# Patient Record
Sex: Female | Born: 1945
Health system: Southern US, Community
[De-identification: ages and names within clinical notes are randomized; demographics above are authoritative.]

## PROBLEM LIST (undated history)

## (undated) DIAGNOSIS — E785 Hyperlipidemia, unspecified: Secondary | ICD-10-CM

## (undated) HISTORY — DX: Hyperlipidemia, unspecified: E78.5

## (undated) HISTORY — PX: EYE SURGERY: SHX253

## (undated) HISTORY — PX: ABDOMINAL HYSTERECTOMY: SHX81

## (undated) HISTORY — PX: APPENDECTOMY: SHX54

---

## 2005-04-26 ENCOUNTER — Ambulatory Visit: Payer: Self-pay | Admitting: Unknown Physician Specialty

## 2005-12-09 HISTORY — PX: COLONOSCOPY: SHX174

## 2005-12-25 ENCOUNTER — Ambulatory Visit: Payer: Self-pay | Admitting: Gastroenterology

## 2006-01-10 ENCOUNTER — Emergency Department: Payer: Self-pay | Admitting: Emergency Medicine

## 2006-04-30 ENCOUNTER — Ambulatory Visit: Payer: Self-pay | Admitting: Unknown Physician Specialty

## 2007-02-12 ENCOUNTER — Ambulatory Visit: Payer: Self-pay | Admitting: Ophthalmology

## 2007-05-28 ENCOUNTER — Ambulatory Visit: Payer: Self-pay | Admitting: Unknown Physician Specialty

## 2008-06-07 ENCOUNTER — Ambulatory Visit: Payer: Self-pay | Admitting: Family Medicine

## 2009-09-19 ENCOUNTER — Ambulatory Visit: Payer: Self-pay | Admitting: Unknown Physician Specialty

## 2009-10-26 ENCOUNTER — Ambulatory Visit: Payer: Self-pay | Admitting: Gastroenterology

## 2010-03-14 ENCOUNTER — Ambulatory Visit: Payer: Self-pay | Admitting: Ophthalmology

## 2010-03-29 ENCOUNTER — Ambulatory Visit: Payer: Self-pay | Admitting: Ophthalmology

## 2010-09-01 ENCOUNTER — Ambulatory Visit: Payer: Self-pay | Admitting: Family Medicine

## 2010-09-24 ENCOUNTER — Ambulatory Visit: Payer: Self-pay | Admitting: Specialist

## 2010-09-25 ENCOUNTER — Ambulatory Visit: Payer: Self-pay | Admitting: Unknown Physician Specialty

## 2010-11-09 HISTORY — PX: LUMBAR MICRODISCECTOMY: SHX99

## 2010-11-23 ENCOUNTER — Encounter (HOSPITAL_COMMUNITY)
Admission: RE | Admit: 2010-11-23 | Discharge: 2010-11-23 | Disposition: A | Discharge status: Home / Self Care | Payer: BC Managed Care – PPO | Source: Ambulatory Visit | Attending: Neurosurgery | Admitting: Neurosurgery

## 2010-11-23 LAB — TYPE AND SCREEN: ABO/RH(D): O POS

## 2010-11-23 LAB — SURGICAL PCR SCREEN: MRSA, PCR: NEGATIVE

## 2010-11-23 LAB — CBC
Hemoglobin: 11.3 g/dL — ABNORMAL LOW (ref 12.0–15.0)
MCH: 29.4 pg (ref 26.0–34.0)
MCHC: 32.5 g/dL (ref 30.0–36.0)
MCV: 90.4 fL (ref 78.0–100.0)

## 2010-11-23 LAB — DIFFERENTIAL
Basophils Relative: 0 % (ref 0–1)
Eosinophils Absolute: 0.2 10*3/uL (ref 0.0–0.7)
Monocytes Absolute: 0.7 10*3/uL (ref 0.1–1.0)
Monocytes Relative: 10 % (ref 3–12)
Neutro Abs: 4.6 10*3/uL (ref 1.7–7.7)

## 2010-11-27 ENCOUNTER — Ambulatory Visit (HOSPITAL_COMMUNITY)
Admission: RE | Admit: 2010-11-27 | Discharge: 2010-11-27 | Disposition: A | Discharge status: Home / Self Care | Payer: BC Managed Care – PPO | Source: Ambulatory Visit | Attending: Neurosurgery | Admitting: Neurosurgery

## 2010-11-27 ENCOUNTER — Inpatient Hospital Stay (HOSPITAL_COMMUNITY): Payer: BC Managed Care – PPO

## 2010-11-27 DIAGNOSIS — M5126 Other intervertebral disc displacement, lumbar region: Secondary | ICD-10-CM | POA: Insufficient documentation

## 2010-11-27 DIAGNOSIS — Z01812 Encounter for preprocedural laboratory examination: Secondary | ICD-10-CM | POA: Insufficient documentation

## 2010-12-06 NOTE — Op Note (Signed)
Janet, Craig              ACCOUNT NO.:  [id number]  MEDICAL RECORD NO.:  [id number]           PATIENT TYPE:  I  LOCATION:  3526                         FACILITY:  [facility]  PHYSICIAN:  Janet Craig Janet Craig, M.D.    DATE OF BIRTH:  Feb 16, 1946  DATE OF PROCEDURE:  11/27/2010 DATE OF DISCHARGE:  11/27/2010                              OPERATIVE REPORT   PREOPERATIVE DIAGNOSIS:  Right L2-3 herniated nucleus pulposus with radiculopathy.  POSTOPERATIVE DIAGNOSIS:  Right L2-3 herniated nucleus pulposus with radiculopathy.  PROCEDURE NOTE:  Right L2-3 laminotomy and microdiskectomy.  SURGEON:  Janet Craig. Janet Riera, MD.  ASSISTANT:  Janet Meeker, MD  ANESTHESIA:  General endotracheal.  INDICATION:  [name] is a 65 year old female with history of back and right groin and anterior thigh pain consistent with right-sided L2 radiculopathy failing conservative management.  Workup demonstrates evidence of a right-sided L2-3 herniated nucleus pulposus with the superior fragment causing compression of the exiting L2 nerve root.  The patient has been counseled as to her options.  She has failed conservative management and presents now for microdiskectomy in hopes of improving her symptoms.  DESCRIPTION OF OPERATION:  The patient was brought to the operating room and placed in the operating table in a supine position.  After adequate level of anesthesia was achieved, the patient was placed on prone onto the Wilson frame and appropriately padded.  The patient's lumbar region was prepped and draped sterilely.  A 10-blade was used to make a curvilinear skin incision overlying the L2-3 interspace.  This was carried down sharply in the midline.  Subperiosteal dissection was performed exposing the lamina of the facet joints, L2 and L3 on the right side.  Deep self-retaining retractor was placed.  Intraoperative x- rays were taken, level was confirmed.  Laminotomy was then performed using a  high-speed drill and Kerrison rongeurs to remove the inferior aspect of the lamina of L2, medial aspect of the L2-3 facet joints, and superior rim of the L3.  The ligamentum flavum was then elevated and resected in piecemeal fashion using Kerrison rongeurs.  The underlying thecal sac and exiting L3 nerve roots were identified.  Microscope was then brought to field for microdissection of the right-sided L2 and L3 nerve roots as well as the underlying disk.  Epidural venous plexus coagulated and cut.  Thecal sac and L3 nerve root was gently mobilized and tracked towards midline.  Disk space and disk herniation was readily apparent.  An incision was made over the superior fragment extending towards the right-sided L2 foramen.  A blunt nerve hook was then used to dissect free a number of large pieces of free disk herniation.  This were completely resected.  The disk space itself was otherwise flat. The annular defect was small.  It was not felt that an intradiskal diskectomy would be of benefit.  At this point, a very thorough decompression had been achieved.  There was no injury to the thecal sac or nerve roots.  Wound was then irrigated with antibiotic solution. Gelfoam was placed topically for hemostasis, which was found to be good.  Microscope and retractors were  removed.  Hemostasis was then achieved with electrocautery.  The wound was then closed in layers with Vicryl sutures.  Steri-Strips and sterile dressing were applied.  The patient tolerated the procedure well and she returns to the recovery room postoperatively.          ______________________________ Janet Craig Zaara Sprowl, M.D.     HAP/MEDQ  D:  11/27/2010  T:  11/28/2010  Job:  [id number]  Electronically Signed by Janet Craig M.D. on 12/06/2010 04:06:19 PM

## 2011-01-14 ENCOUNTER — Ambulatory Visit: Payer: Self-pay | Admitting: Neurosurgery

## 2011-01-31 ENCOUNTER — Encounter: Payer: Self-pay | Admitting: Neurosurgery

## 2011-02-09 ENCOUNTER — Encounter: Payer: Self-pay | Admitting: Neurosurgery

## 2011-03-11 ENCOUNTER — Encounter: Payer: Self-pay | Admitting: Neurosurgery

## 2012-01-17 ENCOUNTER — Ambulatory Visit: Payer: Self-pay | Admitting: Family Medicine

## 2012-01-24 ENCOUNTER — Ambulatory Visit: Payer: Self-pay | Admitting: Family Medicine

## 2012-02-11 ENCOUNTER — Ambulatory Visit: Payer: Self-pay | Admitting: General Practice

## 2012-02-11 LAB — BASIC METABOLIC PANEL
Anion Gap: 7 (ref 7–16)
BUN: 6 mg/dL — ABNORMAL LOW (ref 7–18)
Chloride: 103 mmol/L (ref 98–107)
Co2: 30 mmol/L (ref 21–32)
Creatinine: 0.56 mg/dL — ABNORMAL LOW (ref 0.60–1.30)
EGFR (Non-African Amer.): >60
Glucose: 98 mg/dL (ref 65–99)
Osmolality: 277 (ref 275–301)
Sodium: 140 mmol/L (ref 136–145)

## 2012-02-11 LAB — URINALYSIS, COMPLETE
Bacteria: NONE SEEN
Bilirubin,UR: NEGATIVE
Glucose,UR: NEGATIVE mg/dL (ref 0–75)
Leukocyte Esterase: NEGATIVE

## 2012-02-11 LAB — CBC
HCT: 38.5 % (ref 35.0–47.0)
HGB: 12.6 g/dL (ref 12.0–16.0)
MCH: 29.7 pg (ref 26.0–34.0)
MCHC: 32.6 g/dL (ref 32.0–36.0)
Platelet: 289 10*3/uL (ref 150–440)
RBC: 4.23 10*6/uL (ref 3.80–5.20)
RDW: 13.4 % (ref 11.5–14.5)
WBC: 4.6 10*3/uL (ref 3.6–11.0)

## 2012-02-11 LAB — SEDIMENTATION RATE: Erythrocyte Sed Rate: 15 mm/hr (ref 0–30)

## 2012-02-11 LAB — PROTIME-INR: Prothrombin Time: 12.5 secs (ref 11.5–14.7)

## 2012-02-11 LAB — APTT: Activated PTT: 32.6 secs (ref 23.6–35.9)

## 2012-02-12 LAB — URINE CULTURE

## 2012-02-25 ENCOUNTER — Inpatient Hospital Stay: Payer: Self-pay | Admitting: General Practice

## 2012-02-26 LAB — BASIC METABOLIC PANEL
Co2: 28 mmol/L (ref 21–32)
Creatinine: 0.6 mg/dL (ref 0.60–1.30)
EGFR (African American): >60
Osmolality: 271 (ref 275–301)
Potassium: 3.5 mmol/L (ref 3.5–5.1)

## 2012-02-26 LAB — PLATELET COUNT: Platelet: 238 10*3/uL (ref 150–440)

## 2012-02-26 LAB — HEMOGLOBIN
HGB: 8.3 g/dL — ABNORMAL LOW (ref 12.0–16.0)
HGB: 8.5 g/dL — ABNORMAL LOW (ref 12.0–16.0)

## 2012-02-27 LAB — PLATELET COUNT: Platelet: 238 10*3/uL (ref 150–440)

## 2012-02-27 LAB — BASIC METABOLIC PANEL
Anion Gap: 9 (ref 7–16)
Calcium, Total: 7.7 mg/dL — ABNORMAL LOW (ref 8.5–10.1)
Chloride: 99 mmol/L (ref 98–107)
Co2: 28 mmol/L (ref 21–32)
Glucose: 116 mg/dL — ABNORMAL HIGH (ref 65–99)
Osmolality: 270 (ref 275–301)
Potassium: 3.7 mmol/L (ref 3.5–5.1)

## 2012-08-06 ENCOUNTER — Ambulatory Visit: Payer: Self-pay | Admitting: Family Medicine

## 2012-10-08 ENCOUNTER — Telehealth: Payer: Self-pay | Admitting: Gastroenterology

## 2013-02-10 ENCOUNTER — Ambulatory Visit: Payer: Self-pay | Admitting: General Practice

## 2013-02-10 DIAGNOSIS — Z01810 Encounter for preprocedural cardiovascular examination: Secondary | ICD-10-CM

## 2013-02-10 LAB — URINALYSIS, COMPLETE
Bacteria: NONE SEEN
Bilirubin,UR: NEGATIVE
Blood: NEGATIVE
Glucose,UR: NEGATIVE mg/dL (ref 0–75)
Ketone: NEGATIVE
Leukocyte Esterase: NEGATIVE
Nitrite: NEGATIVE
Ph: 7 (ref 4.5–8.0)
Protein: NEGATIVE
RBC,UR: <1 /HPF (ref 0–5)
Specific Gravity: 1.018 (ref 1.003–1.030)
Squamous Epithelial: <1
WBC UR: <1 /HPF (ref 0–5)

## 2013-02-10 LAB — CBC
HCT: 36.8 % (ref 35.0–47.0)
HGB: 12.3 g/dL (ref 12.0–16.0)
MCH: 29.4 pg (ref 26.0–34.0)
MCHC: 33.5 g/dL (ref 32.0–36.0)
MCV: 88 fL (ref 80–100)
Platelet: 296 10*3/uL (ref 150–440)
RBC: 4.2 10*6/uL (ref 3.80–5.20)
RDW: 14.1 % (ref 11.5–14.5)
WBC: 4.9 10*3/uL (ref 3.6–11.0)

## 2013-02-10 LAB — BASIC METABOLIC PANEL
Anion Gap: 6 — ABNORMAL LOW (ref 7–16)
Calcium, Total: 8.9 mg/dL (ref 8.5–10.1)
Chloride: 105 mmol/L (ref 98–107)
Co2: 28 mmol/L (ref 21–32)
Creatinine: 0.59 mg/dL — ABNORMAL LOW (ref 0.60–1.30)
Sodium: 139 mmol/L (ref 136–145)

## 2013-02-10 LAB — MRSA PCR SCREENING

## 2013-02-10 LAB — PROTIME-INR
INR: 0.9
Prothrombin Time: 12.6 secs (ref 11.5–14.7)

## 2013-02-10 LAB — SEDIMENTATION RATE: Erythrocyte Sed Rate: 11 mm/hr (ref 0–30)

## 2013-02-10 LAB — APTT: Activated PTT: 31.7 secs (ref 23.6–35.9)

## 2013-02-23 ENCOUNTER — Inpatient Hospital Stay: Payer: Self-pay | Admitting: General Practice

## 2013-02-24 HISTORY — PX: TOTAL HIP ARTHROPLASTY: SHX124

## 2013-02-24 LAB — BASIC METABOLIC PANEL
Anion Gap: 7 (ref 7–16)
BUN: 5 mg/dL — ABNORMAL LOW (ref 7–18)
Calcium, Total: 8.1 mg/dL — ABNORMAL LOW (ref 8.5–10.1)
Creatinine: 0.53 mg/dL — ABNORMAL LOW (ref 0.60–1.30)
EGFR (African American): >60
EGFR (Non-African Amer.): >60
Glucose: 130 mg/dL — ABNORMAL HIGH (ref 65–99)
Osmolality: 273 (ref 275–301)
Sodium: 137 mmol/L (ref 136–145)

## 2013-02-24 LAB — HEMOGLOBIN: HGB: 9.7 g/dL — ABNORMAL LOW (ref 12.0–16.0)

## 2013-02-24 LAB — PLATELET COUNT: Platelet: 236 10*3/uL (ref 150–440)

## 2013-02-25 LAB — BASIC METABOLIC PANEL
Anion Gap: 6 — ABNORMAL LOW (ref 7–16)
BUN: 4 mg/dL — ABNORMAL LOW (ref 7–18)
Calcium, Total: 7.8 mg/dL — ABNORMAL LOW (ref 8.5–10.1)
Co2: 26 mmol/L (ref 21–32)
Potassium: 3.4 mmol/L — ABNORMAL LOW (ref 3.5–5.1)

## 2013-02-25 LAB — HEMOGLOBIN: HGB: 9.5 g/dL — ABNORMAL LOW (ref 12.0–16.0)

## 2013-11-12 ENCOUNTER — Ambulatory Visit: Payer: Self-pay | Admitting: Family Medicine

## 2014-10-22 LAB — CBC AND DIFFERENTIAL
HCT: 37 % (ref 36–46)
HEMOGLOBIN: 12.6 g/dL (ref 12.0–16.0)
NEUTROS ABS: 3 /uL
PLATELETS: 300 10*3/uL (ref 150–399)
WBC: 4.3 10^3/mL

## 2014-10-22 LAB — LIPID PANEL
Cholesterol: 211 mg/dL — AB (ref 0–200)
HDL: 48 mg/dL (ref 35–70)
LDL Cholesterol: 145 mg/dL
Triglycerides: 91 mg/dL (ref 40–160)

## 2014-10-22 LAB — TSH: TSH: 0.06 u[IU]/mL — AB (ref 0.41–5.90)

## 2014-10-22 LAB — BASIC METABOLIC PANEL
BUN: 9 mg/dL (ref 4–21)
CREATININE: 0.6 mg/dL (ref 0.5–1.1)
GLUCOSE: 110 mg/dL
POTASSIUM: 4.1 mmol/L (ref 3.4–5.3)
SODIUM: 139 mmol/L (ref 137–147)

## 2014-10-22 LAB — HEMOGLOBIN A1C: Hemoglobin A1C: 6.1

## 2014-10-22 LAB — HEPATIC FUNCTION PANEL
ALT: 13 U/L (ref 7–35)
AST: 17 U/L (ref 13–35)
Alkaline Phosphatase: 73 U/L (ref 25–125)
BILIRUBIN, TOTAL: 0.5 mg/dL

## 2014-10-27 ENCOUNTER — Ambulatory Visit: Payer: Self-pay | Admitting: Family Medicine

## 2014-10-29 ENCOUNTER — Ambulatory Visit: Payer: Self-pay | Admitting: Family Medicine

## 2014-12-07 ENCOUNTER — Ambulatory Visit: Admit: 2014-12-07 | Disposition: A | Discharge status: Home / Self Care | Payer: Self-pay | Admitting: Gastroenterology

## 2014-12-07 LAB — HM COLONOSCOPY

## 2014-12-31 NOTE — Op Note (Signed)
PATIENT NAME:  Janet Craig, Janet Craig MR#:  [id number] DATE OF BIRTH:  1945-11-13  DATE OF PROCEDURE:  02/23/2013  PREOPERATIVE DIAGNOSIS: Degenerative arthrosis of the left hip.   POSTOPERATIVE DIAGNOSIS: Degenerative arthrosis of the left hip.   PROCEDURE PERFORMED: Left total hip arthroplasty.   SURGEON: Laurice Record. Hooten, MD.  ASSISTANTVance Peper, PA.   ANESTHESIA: Spinal.   ESTIMATED BLOOD LOSS: 200 mL.   FLUIDS REPLACED: 1500 mL of crystalloid.   DRAINS: Two medium drains to Hemovac reservoir.   IMPLANTS UTILIZED: DePuy 13.5 mm small stature AML femoral component, a 48 mm outer diameter Pinnacle 100 acetabular component, +4 mm neutral Pinnacle Marathon polyethylene liner and a 32 mm outer diameter cobalt chrome hip ball with a +1 mm neck length.   INDICATIONS FOR SURGERY: The patient is a 69 year old female, who has been seen for complaints of progressive left hip and groin pain. She has noticed some decrease in her range of motion. X-rays demonstrated severe degenerative changes. After discussion of the risks and benefits of surgical intervention, the patient expressed understanding of the risks, benefits and agreed with plans for surgical intervention. Of note, the patient had done well following a right total hip arthroplasty for degenerative arthrosis.   PROCEDURE IN DETAIL: The patient was brought to the operating room and, after adequate spinal anesthesia was achieved, the patient was placed in right lateral decubitus position. Axillary roll was placed and all bony prominences were well padded. The patient's left hip and leg were cleaned and prepped with alcohol and DuraPrep and draped in the usual sterile fashion. A "timeout" was performed as per usual protocol. A lateral curvilinear incision was made gently curving towards the posterior superior iliac spine. IT band was incised in line with the skin incision and the fibers of the gluteus maximus were split in line. The piriformis  tendon was identified, skeletonized, and incised at its insertion on the proximal femur and reflected posteriorly. In a similar fashion, short external rotators were incised and reflected posteriorly. A T-type posterior capsulotomy was performed. Prior to dislocation of the femoral head, a threaded Steinmann pin was inserted into the pelvis superior to the acetabulum through a separate stab incision and then bent in the form of a stylus so as to assess limb length and hip offset throughout the procedure. The femoral head was then dislocated posteriorly. Severe degenerative changes were noted, especially along the superior aspect of the femoral head. The femoral neck cut was performed using an oscillating saw. The anterior capsule was elevated off of the femoral neck. Inspection of the acetabulum also demonstrated significant degenerative changes. Remnant of the labrum was excised. The acetabulum was reamed in a sequential fashion up to a 47 mm diameter. Excellent punctate bleeding bone was encountered.   A 48 mm outer diameter Pinnacle 100 acetabular component was positioned and impacted into place. Excellent scratch fit was appreciated. A +4 mm neutral polyethylene trial was inserted and attention was directed to the proximal femur. Pilot hole for reaming of the proximal femoral canal was created using a high-speed bur. Proximal femoral canal was reamed in a sequential fashion up to a 13 mm diameter. The proximal femur was then prepared using a 13.5 mm aggressive side-biting reamer. Serial broaches were inserted up to a 13.5 mm small stature broach. The calcar region was planed and trial reduction was performed with a 32 mm hip ball with a +1 mm neck length. Excellent equalization of limb lengths was appreciated and good hip offset  was noted. Excellent stability was appreciated both anteriorly and posteriorly. Trial components were removed. The acetabular shell was irrigated and suctioned dry. A +4 mm neutral  Pinnacle Marathon polyethylene liner was positioned and impacted into place. Next, a 13.5 mm small stature AML femoral component was initially seated. There was approximately 7+ cm of scratch fit. It was thus elected to ream lint to line using a 13.5 mm reamer on a T handle. The femoral component was re-seated and impacted into place. Excellent scratch fit was appreciated. The Morse taper was cleaned and dried. A 32 mm outer diameter cobalt chrome hip ball with a +1 mm neck segment was placed on the trunnion and impacted into place. The hip was reduced and placed through a range of motion. Excellent equalization of limb lengths and hip offset were noted. Excellent stability was appreciated both anteriorly and posteriorly.   The hip was irrigated with copious amounts of normal saline with antibiotic solution using pulsatile lavage and then suctioned dry. The posterior capsulotomy was repaired using #5 Ethibond. The piriformis tendon was reapproximated on the undersurface of the gluteus medius tendon using #5 Ethibond. Two medium drains were placed in the wound bed and brought out through a separate stab incision to be attached to Hemovac reservoir. IT band was repaired using interrupted sutures of #1 Vicryl. The subcutaneous tissue was approximated in layers using first #0 Vicryl followed by 2-0 Vicryl. Skin was closed with skin staples. A sterile dressing was applied. The patient tolerated the procedure well. She was transported to the recovery room in stable condition.   ____________________________ Laurice Record. Holley Bouche., MD jph:aw D: 02/23/2013 11:16:00 ET T: 02/23/2013 11:46:49 ET JOB#: [id number]  cc: Jeneen Rinks P. Holley Bouche., MD, <Dictator> JAMES P Holley Bouche MD ELECTRONICALLY SIGNED 02/25/2013 11:28

## 2014-12-31 NOTE — Op Note (Signed)
PATIENT NAME:  Janet Craig, Janet Craig MR#:  [id number] DATE OF BIRTH:  [birth year]  DATE OF PROCEDURE:  02/23/2013  PREOPERATIVE DIAGNOSIS: Degenerative arthrosis of the left hip.   POSTOPERATIVE DIAGNOSIS: Degenerative arthrosis of the left hip.   PROCEDURE PERFORMED: Left total hip arthroplasty.   SURGEON: Laurice Record. Hooten, MD.  ASSISTANTVance Peper, PA.   ANESTHESIA: Spinal.   ESTIMATED BLOOD LOSS: 200 mL.   FLUIDS REPLACED: 1500 mL of crystalloid.   DRAINS: Two medium drains to Hemovac reservoir.   IMPLANTS UTILIZED: DePuy 13.5 mm small stature AML femoral component, a 48 mm outer diameter Pinnacle 100 acetabular component, +4 mm neutral Pinnacle Marathon polyethylene liner and a 32 mm outer diameter cobalt chrome hip ball with a +1 mm neck length.   INDICATIONS FOR SURGERY: The patient is a 69 year old female, who has been seen for complaints of progressive left hip and groin pain. She has noticed some decrease in her range of motion. X-rays demonstrated severe degenerative changes. After discussion of the risks and benefits of surgical intervention, the patient expressed understanding of the risks, benefits and agreed with plans for surgical intervention. Of note, the patient had done well following a right total hip arthroplasty for degenerative arthrosis.   PROCEDURE IN DETAIL: The patient was brought to the operating room and, after adequate spinal anesthesia was achieved, the patient was placed in right lateral decubitus position. Axillary roll was placed and all bony prominences were well padded. The patient's left hip and leg were cleaned and prepped with alcohol and DuraPrep and draped in the usual sterile fashion. A "timeout" was performed as per usual protocol. A lateral curvilinear incision was made gently curving towards the posterior superior iliac spine. IT band was incised in line with the skin incision and the fibers of the gluteus maximus were split in line. The piriformis  tendon was identified, skeletonized, and incised at its insertion on the proximal femur and reflected posteriorly. In a similar fashion, short external rotators were incised and reflected posteriorly. A T-type posterior capsulotomy was performed. Prior to dislocation of the femoral head, a threaded Steinmann pin was inserted into the pelvis superior to the acetabulum through a separate stab incision and then bent in the form of a stylus so as to assess limb length and hip offset throughout the procedure. The femoral head was then dislocated posteriorly. Severe degenerative changes were noted, especially along the superior aspect of the femoral head. The femoral neck cut was performed using an oscillating saw. The anterior capsule was elevated off of the femoral neck. Inspection of the acetabulum also demonstrated significant degenerative changes. Remnant of the labrum was excised. The acetabulum was reamed in a sequential fashion up to a 47 mm diameter. Excellent punctate bleeding bone was encountered.   A 48 mm outer diameter Pinnacle 100 acetabular component was positioned and impacted into place. Excellent scratch fit was appreciated. A +4 mm neutral polyethylene trial was inserted and attention was directed to the proximal femur. Pilot hole for reaming of the proximal femoral canal was created using a high-speed bur. Proximal femoral canal was reamed in a sequential fashion up to a 13 mm diameter. The proximal femur was then prepared using a 13.5 mm aggressive side-biting reamer. Serial broaches were inserted up to a 13.5 mm small stature broach. The calcar region was planed and trial reduction was performed with a 32 mm hip ball with a +1 mm neck length. Excellent equalization of limb lengths was appreciated and good hip offset  was noted. Excellent stability was appreciated both anteriorly and posteriorly. Trial components were removed. The acetabular shell was irrigated and suctioned dry. A +4 mm neutral  Pinnacle Marathon polyethylene liner was positioned and impacted into place. Next, a 13.5 mm small stature AML femoral component was initially seated. There was approximately 7+ cm of scratch fit. It was thus elected to ream lint to line using a 13.5 mm reamer on a T handle. The femoral component was re-seated and impacted into place. Excellent scratch fit was appreciated. The Morse taper was cleaned and dried. A 32 mm outer diameter cobalt chrome hip ball with a +1 mm neck segment was placed on the trunnion and impacted into place. The hip was reduced and placed through a range of motion. Excellent equalization of limb lengths and hip offset were noted. Excellent stability was appreciated both anteriorly and posteriorly.   The hip was irrigated with copious amounts of normal saline with antibiotic solution using pulsatile lavage and then suctioned dry. The posterior capsulotomy was repaired using #5 Ethibond. The piriformis tendon was reapproximated on the undersurface of the gluteus medius tendon using #5 Ethibond. Two medium drains were placed in the wound bed and brought out through a separate stab incision to be attached to Hemovac reservoir. IT band was repaired using interrupted sutures of #1 Vicryl. The subcutaneous tissue was approximated in layers using first #0 Vicryl followed by 2-0 Vicryl. Skin was closed with skin staples. A sterile dressing was applied. The patient tolerated the procedure well. She was transported to the recovery room in stable condition.   ____________________________ Laurice Record. Holley Bouche., MD jph:aw D: 02/23/2013 11:16:58 ET T: 02/23/2013 11:46:49 ET JOB#: [id number]  cc: Jeneen Rinks P. Holley Bouche., MD, <Dictator>

## 2014-12-31 NOTE — Discharge Summary (Signed)
PATIENT NAME:  Janet Craig, Janet Craig MR#:  [id number] DATE OF BIRTH:  08-10-1946  DATE OF ADMISSION:  02/23/2013 DATE OF DISCHARGE:  02/26/2013  ADMITTING DIAGNOSIS: Degenerative arthrosis of the left hip.   DISCHARGE DIAGNOSIS: Degenerative arthrosis of the left hip.   HISTORY: The patient is a 69 year old female who has been following up in our clinic for progression of left hip discomfort. She has reported a several year history of intermittent left hip and groin pain. The pain has gradually increased in severity over the course of the last year. She had noticed some gradual restriction of her left hip range of motion as well. She denied any radiation of pain down the leg. Also denied any gross numbness or causalgia. At the time of surgery, she was not using any ambulatory aids. The patient is status post a right total knee arthroplasty done approximately 1 year ago and has done extremely well and has had no complications. The patient states that the left hip pain had progressed to the point that it significantly interfered with her activities of daily living. X-rays taken in [facility] show significant narrowing of the cartilage space with some subchondral cyst formation noted. After discussion of the risks and benefits for the surgical intervention, the patient expressed her understanding of the risks and benefits and agreed with the plans for surgical intervention.    PROCEDURE: Left total hip arthroplasty.   ANESTHESIA: Spinal.   IMPLANTS UTILIZED: DePuy 13.5 mm small stature AML femoral component, a 48 mm outer diameter Pinnacle 100 acetabular component, +4 mm neutral Pinnacle Marathon polyethylene liner, and a 32 mm outer diameter [place] chrome hip ball with a +1 mm neck length.    HOSPITAL COURSE: The patient tolerated procedure very well. She had no complications. She was then taken to the PACU where she was stabilized and then transferred to the orthopedic floor. The patient  began receiving anticoagulation therapy of Lovenox 30 mg subcutaneous q.12 hours per anesthesia and pharmacy protocol. She was fitted with TED stockings bilaterally. These were allowed to be removed 1 hour per 8 hour shift. She was also fitted with AVI compression foot pumps bilaterally set at 80 mmHg. Her calves have been nontender. There has been no evidence of any DVTs of the lower extremity. Negative Homans sign. The heels were elevated off the bed using rolled towels.   The patient has denied any chest pain or shortness of breath. Vital signs have been stable. She has been afebrile. Hemodynamically, she is stable. No transfusions were given.   Physical therapy was initiated on day 1 for gait training and transfers. She has been somewhat slower than I would anticipate since she has been through this before. The first day, she just basically ambulated around the room which is about half of the distance that she was required to do on the first day. The second day, she did ambulate 80 feet. She slowly progressed. Occupational therapy was also initiated on day 1 for ADLs and assistive devices.   The patient's IV, Foley and Hemovac were DC'd on day 2, along with a dressing change. The wound was free of any drainage or signs of infection.   The patient is being discharged to home in improved stable condition. She may weight bear as tolerated. She is instructed once again on hip precautions. Continue using a walker until cleared by physical therapy and go to a quad cane. She will receive home health PT. Pillows between knees when in bed. She  has a followup appointment with Dr. Marry Guan July 29th or sooner if any temperatures of 101.5 or greater or excessive bleeding. Staples are to be removed on June 30th. Supplied with 1/2 inch Steri-Strips along with benzoin. She is not to get the dressing wet until the staples are removed. She is placed on a regular diet. She is to resume her regular medications that she was  on prior to admission. Was given a prescription for oxycodone 5 to 10 mg q.4 to 6 hours p.r.n. for pain, Ultram 50 mg to 100 mg q.4 to 6 hours p.r.n. for pain and Lovenox 40 mg subcutaneously daily for 14 days, then discontinue and begin taking one 81 mg enteric-coated aspirin.   PAST MEDICAL HISTORY: Arthritis, glaucoma, seasonal allergies.    ____________________________ Vance Peper, PA jrw:gb D: 02/25/2013 18:34:13 ET T: 02/26/2013 00:55:51 ET JOB#: [id number]  cc: Vance Peper, PA, <Dictator> Shevette Bess PA ELECTRONICALLY SIGNED 03/02/2013 17:06

## 2015-01-02 NOTE — Discharge Summary (Signed)
PATIENT NAME:  Janet Craig, Janet Craig MR#:  [id number] DATE OF BIRTH:  14-Oct-1945  DATE OF ADMISSION:  02/25/2012 DATE OF DISCHARGE:  02/28/2012  ADMITTING DIAGNOSIS: Degenerative arthrosis of the right hip.   DISCHARGE DIAGNOSIS: Degenerative arthrosis of the right hip.   HISTORY: The patient is a 69 year old female who has been followed at [facility] for progression of right hip and groin discomfort. The patient had reported a several year history of right hip pain that has significantly increased in severity since having lumbar surgery in March 2012. She reported near giving way of the hip as well as pain with range of motion. She had also noticed some decrease in her range of motion of the hip. Her pain was noted to be aggravated with standing and weight-bearing activities. At the time of surgery, she was not using any ambulatory aid. She had had several near falls due to the right hip symptoms recently. She had not seen any significant improvement in her condition despite Tylenol and aspirin. The pain had increased to the point that it was significantly interfering with her activities of daily living. X-rays taken in the [facility] orthopedic department showed significant narrowing of the cartilage space with bone-on-bone articulation noted superiorly. Subchondral sclerosis as well as some cystic changes were noted. There was mild osteophyte formation noted as well. After discussion of the risks and benefits of surgical intervention, the patient expressed her understanding of the risks and benefits and agreed with plans for surgical intervention.   PROCEDURE: Right total hip arthroplasty.   ANESTHESIA: Spinal.   IMPLANTS UTILIZED: DePuy 13.5 mm small stature AML femoral component, 50 mm outer diameter Pinnacle 100 acetabular component, 32 mm cobalt chrome hip ball with a  +1 mm neck segment, and a  +4 mm neutral Pinnacle Marathon polyethylene insert.   HOSPITAL COURSE: The patient tolerated  the procedure very well. She had no complications. She was then taken to the PAC-U where she was stabilized and then transferred to the orthopedic floor. She began receiving anticoagulation therapy of Lovenox 30 mg subcutaneous every 12 hours per anesthesia and pharmacy protocol. She was fitted with TED stockings bilaterally. These were allowed to be removed one hour per eight hour shift. She was also fitted with the AV-I compression foot pumps set at 80 mmHg bilaterally. Her calves have been nontender. There has been no evidence of any deep venous thromboses. Heels were elevated off the bed using rolled towels.   The patient has denied any chest pain or shortness of breath. Vital signs have been stable. She has been afebrile. Hemodynamically she was stable and no transfusions were given. Follow-up labs were unremarkable.   Physical therapy was initiated on day one for gait training and transfers. She has done very well. Upon being discharged, she was ambulating greater than 275 feet. She was independent with bed to chair transfers. She was able to go up and down four sets of steps. Occupational therapy was also initiated on day one for activities of daily living and assistive devices.   The patient's IV, Foley, and Hemovac were discontinued on day two along with a dressing change. The wound has been free of any drainage or any signs of infection.   The patient is being discharged home in improved stable condition. She may weight bear as tolerated. Continue using a walker until cleared by physical therapy to go to a quad cane. She will receive home health physical therapy. She is to wear her TED stockings during  the day but may remove these at night. She was instructed on wound care. Staples will be removed two weeks postoperative. In the meantime, change dressing as needed. She is also instructed not to take a shower prior to the staples coming out. She is placed on a regular diet. She has a follow-up  appointment to see Dr. Marry Guan in six weeks. She is to call the clinic sooner if any temperatures of 101.5 or greater or excessive bleeding. She is to resume her regular medication that she was on prior to admission. She was given a prescription for Lovenox 40 mg subcutaneously daily for 14 days, then discontinue and begin taking one 81 mg enteric-coated aspirin as well as a prescription for Ultram 1 to 2 tablets every 4 to 6 hours p.r.n. for pain.              PAST MEDICAL HISTORY:  1. Arthritis.  2. Glaucoma. 3. Seasonal allergies. ____________________________ Vance Peper, PA [email or web address] D: 02/28/2012 07:07:30 ET T: 02/28/2012 11:05:51 ET JOB#: [id number]  cc: Vance Peper, PA, <Dictator> Rafay Dahan PA ELECTRONICALLY SIGNED 02/28/2012 21:44

## 2015-01-02 NOTE — Op Note (Signed)
PATIENT NAME:  Janet Craig, CRIMI MR#:  [id number] DATE OF BIRTH:  1946-07-02  DATE OF PROCEDURE:  02/25/2012  PREOPERATIVE DIAGNOSIS: Degenerative arthrosis of the right hip.   POSTOPERATIVE DIAGNOSIS: Degenerative arthrosis of the right hip.  PROCEDURE PERFORMED: Right total hip arthroplasty.   SURGEON: Laurice Record. Hooten, M.D.   ASSISTANT: Vance Peper, PA-C (required to maintain retraction throughout the procedure)   ANESTHESIA: Spinal.   ESTIMATED BLOOD LOSS: 600 mL.   FLUIDS REPLACED: 2000 mL of crystalloid.   DRAINS: Two medium drains to Hemovac reservoir.   IMPLANTS UTILIZED: DePuy 13.5 mm small stature AML femoral component, 50 mm outer diameter Pinnacle 100 acetabular component, 32 mm cobalt chrome hip ball with a +1 mm neck segment, and a +4 mm neutral Pinnacle Marathon polyethylene insert.   INDICATIONS FOR SURGERY: The patient is a 69 year old female who has been seen for complaints of progressive right hip and groin pain. X-rays demonstrated severe degenerative changes. After discussion of the risks and benefits of surgical intervention, the patient expressed her understanding of the risks and benefits and agreed with plans for surgical intervention.   PROCEDURE IN DETAIL: The patient brought into the Operating Room and, after adequate spinal anesthesia was achieved, the patient was placed in a left lateral decubitus position. Axillary roll was placed and all bony prominences were well padded. The patient's right hip and leg were cleaned and prepped with alcohol and DuraPrep and draped in the usual sterile fashion. A "time out" was performed as per usual protocol. A lateral curvilinear incision was made gently curving towards the posterior superior iliac spine. IT band was incised in line with the skin incision and the fibers of the gluteus maximus were split in line. The piriformis tendon was identified, skeletonized, and incised at its insertion at the proximal femur and reflected  posteriorly. In a similar fashion, the short external rotators were incised and reflected posteriorly. A T-type posterior capsulotomy was performed. Prior to dislocation of the femoral head, a threaded Steinmann pin was inserted through a separate stab incision into the pelvis superior to the acetabulum and bent in the form of a stylus so as to assess limb length and hip offset throughout the procedure. The hip was then dislocated posteriorly. Inspection of the femoral head demonstrated severe degenerative change with prominent osteophytes about the neck. Femoral neck cut was performed using an oscillating saw. The anterior capsule was elevated off of the femoral neck. Inspection of the acetabulum also demonstrated significant degenerative changes. Remnant of the labrum was excised. The acetabulum was reamed in a sequential fashion up to a 49 mm diameter. This allowed for good punctate bleeding bone. A 50 mm outer diameter Pinnacle 100 acetabular component was positioned and impacted into place. Excellent scratch fit was achieved. A +4 neutral polyethylene trial liner was inserted and attention was directed to the proximal femur. Pilot hole for reaming of the proximal femoral canal was created using a high-speed bur. Proximal femoral canal was reamed in a sequential fashion up to a 13 mm diameter. This allowed for 6 cm of scratch fit. The proximal portion of the femur was prepared using a 13.5 mm aggressive side-biting reamer. Serial broaches were inserted up to a 13.5 mm small stature broach. The calcar region was planed and trial reduction was performed with a 32 mm outer diameter hip ball with a +1 mm neck length. Excellent stability was appreciated both anteriorly and posteriorly. There was good equalization of limb lengths. Trial components were removed. The  acetabular shell was irrigated and suctioned dry. A +4 mm neutral Pinnacle Marathon polyethylene liner was positioned and impacted into place. Next, a 13.5  mm small stature AML femoral component was positioned and impacted into place. Excellent scratch fit was achieved. Trial reduction was again performed with a 32 mm trial ball with a +1 mm neck length. Again, excellent stability both anteriorly and posteriorly was appreciated. Good equalization of limb lengths was noted and restoration of hip offset was also appreciated. The trial was removed and the [facility] taper was cleaned and dried. A 32 mm cobalt chrome hip ball with a +1 mm neck length was placed on the trunnion and impacted into place. The hip was reduced with good equalization of limb lengths, excellent stability, and appropriate restoration of hip offset noted.   The wound was irrigated with copious amounts of normal saline with antibiotic solution using pulsatile lavage and then suctioned dry. Good hemostasis was appreciated. The piriformis tendon was reapproximated on the undersurface of the gluteus medius tendon using #5 Ethibond. This was performed after repair of the posterior capsulotomy using #5 Ethibond. Two medium drains were placed in the wound bed and brought out through a separate stab incision to be attached to a Hemovac reservoir. IT band was repaired using interrupted sutures of #1 Vicryl. The subcutaneous tissue was approximated in layers using first #0 Vicryl followed by #2-0 Vicryl. The skin was closed with skin staples. A sterile dressing was applied.   The patient tolerated the procedure well. She was transported to the recovery room in stable condition.  ____________________________ Laurice Record. Holley Bouche., MD jph:[email or web address] D: 02/25/2012 10:42:34 ET T: 02/25/2012 11:47:00 ET JOB#: [id number]  cc: Jeneen Rinks P. Holley Bouche., MD, <Dictator> Laurice Record Holley Bouche MD ELECTRONICALLY SIGNED 02/29/2012 12:43

## 2015-03-22 ENCOUNTER — Other Ambulatory Visit: Payer: Self-pay | Admitting: Family Medicine

## 2015-03-22 DIAGNOSIS — E039 Hypothyroidism, unspecified: Secondary | ICD-10-CM

## 2015-03-22 NOTE — Telephone Encounter (Signed)
Last OV 10/2014

## 2015-08-12 ENCOUNTER — Other Ambulatory Visit: Payer: Self-pay | Admitting: Family Medicine

## 2015-08-12 DIAGNOSIS — E039 Hypothyroidism, unspecified: Secondary | ICD-10-CM

## 2015-10-11 DIAGNOSIS — G4700 Insomnia, unspecified: Secondary | ICD-10-CM | POA: Insufficient documentation

## 2015-10-11 DIAGNOSIS — M169 Osteoarthritis of hip, unspecified: Secondary | ICD-10-CM | POA: Insufficient documentation

## 2015-10-11 DIAGNOSIS — H409 Unspecified glaucoma: Secondary | ICD-10-CM | POA: Insufficient documentation

## 2015-10-11 DIAGNOSIS — E039 Hypothyroidism, unspecified: Secondary | ICD-10-CM | POA: Insufficient documentation

## 2015-10-11 DIAGNOSIS — R7303 Prediabetes: Secondary | ICD-10-CM | POA: Insufficient documentation

## 2015-10-11 DIAGNOSIS — E785 Hyperlipidemia, unspecified: Secondary | ICD-10-CM | POA: Insufficient documentation

## 2015-10-25 ENCOUNTER — Ambulatory Visit (INDEPENDENT_AMBULATORY_CARE_PROVIDER_SITE_OTHER): Payer: Medicare Other | Admitting: Family Medicine

## 2015-10-25 ENCOUNTER — Encounter: Payer: Self-pay | Admitting: Family Medicine

## 2015-10-25 VITALS — BP 96/56 | HR 72 | Temp 98.0°F | Resp 16 | Ht 66.0 in | Wt 133.0 lb

## 2015-10-25 DIAGNOSIS — Z1239 Encounter for other screening for malignant neoplasm of breast: Secondary | ICD-10-CM | POA: Diagnosis not present

## 2015-10-25 DIAGNOSIS — Z0000 Encounter for general adult medical examination without abnormal findings: Secondary | ICD-10-CM

## 2015-10-25 DIAGNOSIS — E039 Hypothyroidism, unspecified: Secondary | ICD-10-CM

## 2015-10-25 DIAGNOSIS — E785 Hyperlipidemia, unspecified: Secondary | ICD-10-CM | POA: Diagnosis not present

## 2015-10-25 DIAGNOSIS — R739 Hyperglycemia, unspecified: Secondary | ICD-10-CM

## 2015-10-25 DIAGNOSIS — Z23 Encounter for immunization: Secondary | ICD-10-CM

## 2015-10-25 NOTE — Progress Notes (Signed)
Patient ID: SANDE KEATS, female   DOB: April 25, 1946, 70 y.o.   MRN: [id number]       Patient: Janet Craig, Female    DOB: 1946-05-11, 70 y.o.   MRN: [id number] Visit Date: 10/25/2015  Today's Provider: Margarita Rana, MD   Chief Complaint  Patient presents with  . Medicare Wellness   Subjective:    Annual wellness visit Janet Craig is a 70 y.o. female. She feels well. She reports exercising daily. She reports she is sleeping well. 10/20/14 CPE 01/10/12 Pap-neg 10/29/14 Mammo-BI-RADS 1 12/07/14 Colon-diverticulosis, recheck in 5 yrs 10/27/14 BMD-osteopenia  -----------------------------------------------------------   Review of Systems  Constitutional: Negative.   HENT: Negative.   Eyes: Negative.   Respiratory: Negative.   Cardiovascular: Negative.   Gastrointestinal: Negative.   Endocrine: Negative.   Genitourinary: Negative.   Musculoskeletal: Negative.   Skin: Negative.   Allergic/Immunologic: Negative.   Neurological: Negative.   Hematological: Negative.   Psychiatric/Behavioral: Negative.     Social History   Social History  . Marital Status: Married    Spouse Name: N/A  . Number of Children: N/A  . Years of Education: N/A   Occupational History  . Not on file.   Social History Main Topics  . Smoking status: Never Smoker   . Smokeless tobacco: Never Used  . Alcohol Use: No  . Drug Use: No  . Sexual Activity: Not on file   Other Topics Concern  . Not on file   Social History Narrative    History reviewed. No pertinent past medical history.   Patient Active Problem List   Diagnosis Date Noted  . Glaucoma 10/11/2015  . Blood glucose elevated 10/11/2015  . HLD (hyperlipidemia) 10/11/2015  . Adult hypothyroidism 10/11/2015  . Cannot sleep 10/11/2015  . Degenerative arthritis of hip 10/11/2015    Past Surgical History  Procedure Laterality Date  . Total hip arthroplasty Left 02/24/13  . Lumbar microdiscectomy  11/2010  .  Appendectomy    . Abdominal hysterectomy    . Eye surgery    . Colonoscopy  12/2005    Her family history includes Breast cancer in her mother; CVA in her father; Colon cancer in her paternal aunt; Lung cancer in her father and maternal grandfather; Lupus in her brother; Melanoma in her father; Multiple sclerosis in her sister.    Previous Medications   LEVOTHYROXINE (SYNTHROID, LEVOTHROID) 75 MCG TABLET    take 1 tablet by mouth once daily   MULTIPLE VITAMIN TABLET    Take by mouth.   OMEGA-3 FATTY ACIDS (FISH OIL MAXIMUM STRENGTH) 1200 MG CPDR    Take by mouth.    Patient Care Team: Margarita Rana, MD as PCP - General (Family Medicine)     Objective:   Vitals: BP 96/56 mmHg  Pulse 72  Temp(Src) 98 F (36.7 C) (Oral)  Resp 16  Ht 5\' 6"  (1.676 m)  Wt 133 lb (60.328 kg)  BMI 21.48 kg/m2  Physical Exam  Constitutional: She is oriented to person, place, and time. She appears well-developed and well-nourished.  HENT:  Head: Normocephalic and atraumatic.  Right Ear: Tympanic membrane, external ear and ear canal normal.  Left Ear: Tympanic membrane, external ear and ear canal normal.  Nose: Nose normal.  Mouth/Throat: Uvula is midline, oropharynx is clear and moist and mucous membranes are normal.  Eyes: Conjunctivae, EOM and lids are normal. Pupils are equal, round, and reactive to light.  Neck: Trachea normal and normal range of motion. Neck  supple. Carotid bruit is not present. No thyroid mass and no thyromegaly present.  Cardiovascular: Normal rate, regular rhythm and normal heart sounds.   Pulmonary/Chest: Effort normal and breath sounds normal.  Abdominal: Soft. Normal appearance and bowel sounds are normal. There is no hepatosplenomegaly. There is no tenderness.  Genitourinary: No breast swelling, tenderness or discharge.  Musculoskeletal: Normal range of motion.  Lymphadenopathy:    She has no cervical adenopathy.    She has no axillary adenopathy.  Neurological: She is  alert and oriented to person, place, and time. She has normal strength. No cranial nerve deficit.  Skin: Skin is warm, dry and intact.  Psychiatric: She has a normal mood and affect. Her speech is normal and behavior is normal. Judgment and thought content normal. Cognition and memory are normal.    Activities of Daily Living In your present state of health, do you have any difficulty performing the following activities: 10/25/2015  Hearing? N  Vision? N  Difficulty concentrating or making decisions? N  Walking or climbing stairs? N  Dressing or bathing? N  Doing errands, shopping? N    Fall Risk Assessment Fall Risk  10/25/2015  Falls in the past year? No     Depression Screen PHQ 2/9 Scores 10/25/2015  PHQ - 2 Score 0    Cognitive Testing - 6-CIT  Correct? Score   What year is it? yes 0 0 or 4  What month is it? yes 0 0 or 3  Memorize:    Pia Mau,  43,  [address]  [place],      What time is it? (within 1 hour) yes 0 0 or 3  Count backwards from 20 yes 0 0, 2, or 4  Name the months of the year yes 0 0, 2, or 4  Repeat name & address above no 1 0, 2, 4, 6, 8, or 10       TOTAL SCORE  1/28   Interpretation:  Normal  Normal (0-7) Abnormal (8-28)       Assessment & Plan:     Annual Wellness Visit  Reviewed patient's Family Medical History Reviewed and updated list of patient's medical providers Assessment of cognitive impairment was done Assessed patient's functional ability Established a written schedule for health screening [place] Completed and Reviewed  Exercise Activities and Dietary recommendations Goals    None      Immunization History  Administered Date(s) Administered  . Pneumococcal Conjugate-13 10/20/2014  . Pneumococcal Polysaccharide-23 10/25/2015   1. Medicare annual wellness visit, subsequent As above.    2. Breast cancer screening - MM DIGITAL SCREENING BILATERAL; Future  3. Need for pneumococcal  vaccination Given today.  - Pneumococcal polysaccharide vaccine 23-valent greater than or equal to 2yo subcutaneous/IM  4. Hypothyroidism, unspecified hypothyroidism type Stable.   - CBC with Differential/Platelet - TSH  5. Blood glucose elevated Check labs. Doing very well with current lifestyle status post hip surgery.  - Hemoglobin A1c  6. HLD (hyperlipidemia) Check labs.   - Comprehensive metabolic panel - Lipid Panel With LDL/HDL Ratio   Patient was seen and examined by Jerrell Belfast, MD, and note scribed by Lynford Humphrey, [place].   I have reviewed the document for accuracy and completeness and I agree with above. Jerrell Belfast, MD    Margarita Rana, MD    ------------------------------------------------------------------------------------------------------------

## 2015-10-29 LAB — COMPREHENSIVE METABOLIC PANEL
A/G RATIO: 1.7 (ref 1.1–2.5)
ALK PHOS: 72 IU/L (ref 39–117)
ALT: 12 IU/L (ref 0–32)
AST: 21 IU/L (ref 0–40)
Albumin: 4.4 g/dL (ref 3.6–4.8)
BILIRUBIN TOTAL: 0.5 mg/dL (ref 0.0–1.2)
BUN/Creatinine Ratio: 18 (ref 11–26)
BUN: 10 mg/dL (ref 8–27)
CHLORIDE: 99 mmol/L (ref 96–106)
CO2: 26 mmol/L (ref 18–29)
Calcium: 9.5 mg/dL (ref 8.7–10.3)
Creatinine, Ser: 0.55 mg/dL — ABNORMAL LOW (ref 0.57–1.00)
GFR calc non Af Amer: 97 mL/min/{1.73_m2} (ref >59)
GFR, EST AFRICAN AMERICAN: 111 mL/min/{1.73_m2} (ref >59)
GLUCOSE: 103 mg/dL — AB (ref 65–99)
Globulin, Total: 2.6 g/dL (ref 1.5–4.5)
POTASSIUM: 4.6 mmol/L (ref 3.5–5.2)
Sodium: 140 mmol/L (ref 134–144)
TOTAL PROTEIN: 7 g/dL (ref 6.0–8.5)

## 2015-10-29 LAB — CBC WITH DIFFERENTIAL/PLATELET
BASOS: 1 %
Basophils Absolute: 0 10*3/uL (ref 0.0–0.2)
EOS (ABSOLUTE): 0.1 10*3/uL (ref 0.0–0.4)
EOS: 2 %
HEMATOCRIT: 40.1 % (ref 34.0–46.6)
HEMOGLOBIN: 12.9 g/dL (ref 11.1–15.9)
IMMATURE GRANULOCYTES: 0 %
Immature Grans (Abs): 0 10*3/uL (ref 0.0–0.1)
LYMPHS ABS: 1.7 10*3/uL (ref 0.7–3.1)
Lymphs: 38 %
MCH: 28.5 pg (ref 26.6–33.0)
MCHC: 32.2 g/dL (ref 31.5–35.7)
MCV: 89 fL (ref 79–97)
MONOCYTES: 10 %
Monocytes Absolute: 0.4 10*3/uL (ref 0.1–0.9)
Neutrophils Absolute: 2.2 10*3/uL (ref 1.4–7.0)
Neutrophils: 49 %
PLATELETS: 341 10*3/uL (ref 150–379)
RBC: 4.53 x10E6/uL (ref 3.77–5.28)
RDW: 13.3 % (ref 12.3–15.4)
WBC: 4.5 10*3/uL (ref 3.4–10.8)

## 2015-10-29 LAB — LIPID PANEL WITH LDL/HDL RATIO
CHOLESTEROL TOTAL: 196 mg/dL (ref 100–199)
HDL: 50 mg/dL (ref >39)
LDL CALC: 127 mg/dL — AB (ref 0–99)
LDL/HDL RATIO: 2.5 ratio (ref 0.0–3.2)
Triglycerides: 94 mg/dL (ref 0–149)
VLDL Cholesterol Cal: 19 mg/dL (ref 5–40)

## 2015-10-29 LAB — TSH: TSH: <0.006 u[IU]/mL — ABNORMAL LOW (ref 0.450–4.500)

## 2015-10-29 LAB — HEMOGLOBIN A1C
ESTIMATED AVERAGE GLUCOSE: 128 mg/dL
HEMOGLOBIN A1C: 6.1 % — AB (ref 4.8–5.6)

## 2015-10-31 ENCOUNTER — Telehealth: Payer: Self-pay

## 2015-10-31 DIAGNOSIS — E039 Hypothyroidism, unspecified: Secondary | ICD-10-CM

## 2015-10-31 MED ORDER — LEVOTHYROXINE SODIUM 50 MCG PO TABS: 50.0000 ug | ORAL_TABLET | Freq: Every day | ORAL | Status: DC

## 2015-10-31 NOTE — Telephone Encounter (Signed)
-----   Message from Margarita Rana, MD sent at 10/29/2015  9:50 AM EST ----- Labs stable except for thyroid. Really overcorrected. Would recommend decrease to 50 mcg and recheck TSH 6 weeks after change. Blood sugar in pre-diabetic range.  Make sure to continue to eat healthy and exercise.   LDL cholesterol elevated but balanced by good cholesterol. Average 10 year risk of heart disease and medication is not indicated  for this.  Thanks.

## 2015-10-31 NOTE — Telephone Encounter (Signed)
Advised pt of lab results. Pt verbally acknowledges understanding. Sent new rx in to pharmacy. Renaldo Fiddler, CMA

## 2015-12-12 ENCOUNTER — Telehealth: Payer: Self-pay

## 2015-12-12 DIAGNOSIS — E039 Hypothyroidism, unspecified: Secondary | ICD-10-CM

## 2015-12-12 NOTE — Telephone Encounter (Signed)
Patient called stating that she needed to have her TSH re checked per last lab results. Please let pt know when lab slip is ready and if she needs to have anything else re checked with TSH. Thank you-aa

## 2015-12-13 NOTE — Telephone Encounter (Signed)
Ok to order. Please make sure she did not need anything else. Print and notify patient. Thanks.

## 2015-12-13 NOTE — Telephone Encounter (Signed)
Advised patient as below. Patient is only due for repeat TSH. Lab slip printed.

## 2015-12-24 LAB — TSH: TSH: 0.588 u[IU]/mL (ref 0.450–4.500)

## 2016-01-10 ENCOUNTER — Other Ambulatory Visit: Payer: Self-pay | Admitting: Family Medicine

## 2016-01-10 ENCOUNTER — Ambulatory Visit
Admission: RE | Admit: 2016-01-10 | Discharge: 2016-01-10 | Disposition: A | Discharge status: Home / Self Care | Payer: Medicare Other | Source: Ambulatory Visit | Attending: Family Medicine | Admitting: Family Medicine

## 2016-01-10 DIAGNOSIS — Z1231 Encounter for screening mammogram for malignant neoplasm of breast: Secondary | ICD-10-CM

## 2016-01-10 DIAGNOSIS — Z1239 Encounter for other screening for malignant neoplasm of breast: Secondary | ICD-10-CM

## 2016-05-14 ENCOUNTER — Other Ambulatory Visit: Payer: Self-pay | Admitting: Family Medicine

## 2016-05-14 DIAGNOSIS — E039 Hypothyroidism, unspecified: Secondary | ICD-10-CM

## 2016-12-04 ENCOUNTER — Other Ambulatory Visit: Payer: Self-pay | Admitting: Family Medicine

## 2016-12-04 DIAGNOSIS — E039 Hypothyroidism, unspecified: Secondary | ICD-10-CM

## 2017-01-10 ENCOUNTER — Ambulatory Visit (INDEPENDENT_AMBULATORY_CARE_PROVIDER_SITE_OTHER): Payer: Medicare Other | Admitting: Physician Assistant

## 2017-01-10 ENCOUNTER — Encounter: Payer: Self-pay | Admitting: Physician Assistant

## 2017-01-10 ENCOUNTER — Ambulatory Visit: Payer: Medicare Other | Admitting: Physician Assistant

## 2017-01-10 VITALS — BP 102/62 | HR 72 | Temp 97.5°F | Resp 16 | Wt 124.0 lb

## 2017-01-10 DIAGNOSIS — S39012A Strain of muscle, fascia and tendon of lower back, initial encounter: Secondary | ICD-10-CM | POA: Diagnosis not present

## 2017-01-10 DIAGNOSIS — M62830 Muscle spasm of back: Secondary | ICD-10-CM

## 2017-01-10 MED ORDER — MELOXICAM 15 MG PO TABS
15.0000 mg | ORAL_TABLET | Freq: Every day | ORAL | 0 refills | Status: DC
Start: 2017-01-10 — End: 2017-03-29

## 2017-01-10 MED ORDER — METHOCARBAMOL 500 MG PO TABS
500.0000 mg | ORAL_TABLET | Freq: Three times a day (TID) | ORAL | 0 refills | Status: DC | PRN
Start: 2017-01-10 — End: 2017-03-29

## 2017-01-10 NOTE — Patient Instructions (Signed)
Low Back Strain A strain is a stretch or tear in a muscle or the strong cords of tissue that attach muscle to bone (tendons). Strains of the lower back (lumbar spine) are a common cause of low back pain. A strain occurs when muscles or tendons are torn or are stretched beyond their limits. The muscles may become inflamed, resulting in pain and sudden muscle tightening (spasms). A strain can happen suddenly due to an injury (trauma), or it can develop gradually due to overuse. There are three types of strains:  Grade 1 is a mild strain involving a minor tear of the muscle fibers or tendons. This may cause some pain but no loss of muscle strength.  Grade 2 is a moderate strain involving a partial tear of the muscle fibers or tendons. This causes more severe pain and some loss of muscle strength.  Grade 3 is a severe strain involving a complete tear of the muscle or tendon. This causes severe pain and complete or nearly complete loss of muscle strength. What are the causes? This condition may be caused by:  Trauma, such as a fall or a hit to the body.  Twisting or overstretching the back. This may result from doing activities that require a lot of energy, such as lifting heavy objects. What increases the risk? The following factors may increase your risk of getting this condition:  Playing contact sports.  Participating in sports or activities that put excessive stress on the back and require a lot of bending and twisting, including:  Lifting weights or heavy objects.  Gymnastics.  Soccer.  Figure skating.  Snowboarding.  Being overweight or obese.  Having poor strength and flexibility. What are the signs or symptoms? Symptoms of this condition may include:  Sharp or dull pain in the lower back that does not go away. Pain may extend to the buttocks.  Stiffness.  Limited range of motion.  Inability to stand up straight due to stiffness or pain.  Muscle spasms. How is this  diagnosed?   This condition may be diagnosed based on:  Your symptoms.  Your medical history.  A physical exam.  Your health care provider may push on certain areas of your back to determine the source of your pain.  You may be asked to bend forward, backward, and side to side to assess the severity of your pain and your range of motion.  Imaging tests, such as:  X-rays.  MRI. How is this treated? Treatment for this condition may include:  Applying heat and cold to the affected area.  Medicines to help relieve pain and to relax your muscles (muscle relaxants).  NSAIDs to help reduce swelling and discomfort.  Physical therapy. When your symptoms improve, it is important to gradually return to your normal routine as soon as possible to reduce pain, avoid stiffness, and avoid loss of muscle strength. Generally, symptoms should improve within 6 weeks of treatment. However, recovery time varies. Follow these instructions at home: Managing pain, stiffness, and swelling  If directed, apply ice to the injured area during the first 24 hours after your injury.  Put ice in a plastic bag.  Place a towel between your skin and the bag.  Leave the ice on for 20 minutes, 2-3 times a day.  If directed, apply heat to the affected area as often as told by your health care provider. Use the heat source that your health care provider recommends, such as a moist heat pack or a heating pad.    Place a towel between your skin and the heat source.  Leave the heat on for 20-30 minutes.  Remove the heat if your skin turns bright red. This is especially important if you are unable to feel pain, heat, or cold. You may have a greater risk of getting burned. Activity  Rest and return to your normal activities as told by your health care provider. Ask your health care provider what activities are safe for you.  Avoid activities that take a lot of effort (are strenuous) for as long as told by your  health care provider.  Do exercises as told by your health care provider. General instructions  Take over-the-counter and prescription medicines only as told by your health care provider.  If you have questions or concerns about safety while taking pain medicine, talk with your health care provider.  Do not drive or operate heavy machinery until you know how your pain medicine affects you.  Do not use any tobacco products, such as cigarettes, chewing tobacco, and e-cigarettes. Tobacco can delay bone healing. If you need help quitting, ask your health care provider.  Keep all follow-up visits as told by your health care provider. This is important. How is this prevented?  Warm up and stretch before being active.  Cool down and stretch after being active.  Give your body time to rest between periods of activity.  Avoid:  Being physically inactive for long periods at a time.  Exercising or playing sports when you are tired or in pain.  Use correct form when playing sports and lifting heavy objects.  Use good posture when sitting and standing.  Maintain a healthy weight.  Sleep on a mattress with medium firmness to support your back.  Make sure to use equipment that fits you, including shoes that fit well.  Be safe and responsible while being active to avoid falls.  Do at least 150 minutes of moderate-intensity exercise each week, such as brisk walking or water aerobics. Try a form of exercise that takes stress off your back, such as swimming or stationary cycling.  Maintain physical fitness, including:  Strength.  Flexibility.  Cardiovascular fitness.  Endurance. Contact a health care provider if:  Your back pain does not improve after 6 weeks of treatment.  Your symptoms get worse. Get help right away if:  Your back pain is severe.  You are unable to stand or walk.  You develop pain in your legs.  You develop weakness in your buttocks or legs.  You have  difficulty controlling when you urinate or when you have a bowel movement. This information is not intended to replace advice given to you by your health care provider. Make sure you discuss any questions you have with your health care provider. Document Released: 08/27/2005 Document Revised: 05/03/2016 Document Reviewed: 06/08/2015 Elsevier Interactive Patient Education  2017 [employer]  

## 2017-01-10 NOTE — Progress Notes (Signed)
Patient: Janet Craig Female    DOB: 26-Apr-1946   71 y.o.   MRN: [id number] Visit Date: 01/10/2017  Today's Provider: Mar Daring, PA-C   Chief Complaint  Patient presents with  . Back Pain   Subjective:    Back Pain  This is a new problem. The current episode started yesterday (after gardening). The problem occurs constantly. The problem has been gradually improving (with ASA use) since onset. The pain is present in the lumbar spine. The quality of the pain is described as aching and shooting. The pain does not radiate. The pain is at a severity of 7/10. The pain is moderate. The symptoms are aggravated by position. Pertinent negatives include no abdominal pain, bladder incontinence, bowel incontinence, chest pain, dysuria, fever, headaches, leg pain, numbness, paresthesias, pelvic pain, tingling or weakness.   She reports that she was outside working in a garden yesterday for about an hour and a half. She was doing a lot of bending over and pulling. She then noticed her back getting tighter and becoming painful on the right side. She denies any radiating symptoms. Aspirin did help last night, but she did have difficulty finding a comfortable position in bed to sleep.     Allergies  Allergen Reactions  . Neomycin-Bacitracin Zn-Polymyx   . Penicillins   . Miconazole Nitrate Rash     Current Outpatient Prescriptions:  .  levothyroxine (SYNTHROID, LEVOTHROID) 50 MCG tablet, take 1 tablet by mouth once daily, Disp: 30 tablet, Rfl: 2 .  Multiple Vitamin tablet, Take by mouth., Disp: , Rfl:  .  Omega-3 Fatty Acids (FISH OIL MAXIMUM STRENGTH) 1200 MG CPDR, Take by mouth., Disp: , Rfl:   Review of Systems  Constitutional: Negative for activity change, appetite change, chills, diaphoresis, fatigue, fever and unexpected weight change.  Cardiovascular: Negative for chest pain.  Gastrointestinal: Negative for abdominal pain, bowel incontinence, constipation and diarrhea.    Endocrine: Negative for cold intolerance and heat intolerance.  Genitourinary: Negative for bladder incontinence, dysuria and pelvic pain.  Musculoskeletal: Positive for back pain.  Neurological: Negative for tingling, weakness, numbness, headaches and paresthesias.    Social History  Substance Use Topics  . Smoking status: Never Smoker  . Smokeless tobacco: Never Used  . Alcohol use No   Objective:   BP 102/62 (BP Location: Left Arm, Patient Position: Sitting, Cuff Size: Normal)   Pulse 72   Temp 97.5 F (36.4 C) (Oral)   Resp 16   Wt 124 lb (56.2 kg)   BMI 20.01 kg/m  Vitals:   01/10/17 1011  BP: 102/62  Pulse: 72  Resp: 16  Temp: 97.5 F (36.4 C)  TempSrc: Oral  Weight: 124 lb (56.2 kg)     Physical Exam  Constitutional: She appears well-developed and well-nourished. No distress.  Cardiovascular: Normal rate, regular rhythm and normal heart sounds.  Exam reveals no gallop and no friction rub.   No murmur heard. Pulmonary/Chest: Effort normal and breath sounds normal. No respiratory distress. She has no wheezes. She has no rales.  Musculoskeletal:       Thoracic back: Normal.       Lumbar back: She exhibits tenderness, bony tenderness and spasm (right sided lower paraspinal). She exhibits normal range of motion and no pain.  Skin: She is not diaphoretic.  Vitals reviewed.      Assessment & Plan:     1. Strain of lumbar region, initial encounter Worsening. Will treat with robaxin for  spasm and meloxicam for inflammation. She is to call if symptoms worsen or do not improve.  - methocarbamol (ROBAXIN) 500 MG tablet; Take 1 tablet (500 mg total) by mouth every 8 (eight) hours as needed for muscle spasms.  Dispense: 30 tablet; Refill: 0 - meloxicam (MOBIC) 15 MG tablet; Take 1 tablet (15 mg total) by mouth daily.  Dispense: 30 tablet; Refill: 0  2. Muscle spasm of back See above medical treatment plan. - methocarbamol (ROBAXIN) 500 MG tablet; Take 1 tablet (500  mg total) by mouth every 8 (eight) hours as needed for muscle spasms.  Dispense: 30 tablet; Refill: 0 - meloxicam (MOBIC) 15 MG tablet; Take 1 tablet (15 mg total) by mouth daily.  Dispense: 30 tablet; Refill: 0       Mar Daring, PA-C  [facility]

## 2017-02-07 ENCOUNTER — Ambulatory Visit (INDEPENDENT_AMBULATORY_CARE_PROVIDER_SITE_OTHER): Payer: Medicare Other | Admitting: Family Medicine

## 2017-02-07 ENCOUNTER — Encounter: Payer: Self-pay | Admitting: Family Medicine

## 2017-02-07 VITALS — BP 104/60 | HR 79 | Temp 98.7°F | Resp 16 | Wt 125.6 lb

## 2017-02-07 DIAGNOSIS — R1032 Left lower quadrant pain: Secondary | ICD-10-CM | POA: Diagnosis not present

## 2017-02-07 DIAGNOSIS — K625 Hemorrhage of anus and rectum: Secondary | ICD-10-CM | POA: Diagnosis not present

## 2017-02-07 MED ORDER — CIPROFLOXACIN HCL 500 MG PO TABS: 500.0000 mg | ORAL_TABLET | Freq: Two times a day (BID) | ORAL | 0 refills | Status: DC

## 2017-02-07 MED ORDER — METRONIDAZOLE 500 MG PO TABS: 500.0000 mg | ORAL_TABLET | Freq: Two times a day (BID) | ORAL | 0 refills | Status: DC

## 2017-02-07 NOTE — Patient Instructions (Signed)
We will call you with the lab results. Let us know if new symptoms.

## 2017-02-07 NOTE — Progress Notes (Signed)
Subjective:     Patient ID: Janet Craig, female   DOB: 21-Jun-1946, 71 y.o.   MRN: [id number]  HPI  Chief Complaint  Patient presents with  . Abdominal Pain    Patient comes in office today with concerns of intermittent abdominal cramping for the past 2 weeks. Patient describes pain as sharp, this morning when passing bowel movement patient states that stool was more firmer and had blood in it.   Reports the cramping was in her lower abdomen. States blood on and in stool this Am was both bright and dull red. No pain or straining with defecation. Reports daily bowel movements. Colonoscopy 2016 [facility]) with diverticulosis but no current polyps.   Review of Systems     Objective:   Physical Exam  Constitutional: She appears well-developed and well-nourished. No distress.  Abdominal: Soft. There is tenderness (mild in one specific area of LLQ). There is no guarding.       Assessment:    1. Left lower quadrant pain: cover for diverticulitis - ciprofloxacin (CIPRO) 500 MG tablet; Take 1 tablet (500 mg total) by mouth 2 (two) times daily.  Dispense: 14 tablet; Refill: 0 - metroNIDAZOLE (FLAGYL) 500 MG tablet; Take 1 tablet (500 mg total) by mouth 2 (two) times daily.  Dispense: 14 tablet; Refill: 0  2. Bright red rectal bleeding - CBC with Differential/Platelet    Plan:    Further f/u pending lab work. Refer to [facility] If significant anemia or recurrent bleeding.

## 2017-02-09 LAB — CBC WITH DIFFERENTIAL/PLATELET
Basophils Absolute: 0 10*3/uL (ref 0.0–0.2)
Basos: 1 %
EOS (ABSOLUTE): 0.1 10*3/uL (ref 0.0–0.4)
EOS: 1 %
HEMATOCRIT: 34.4 % (ref 34.0–46.6)
HEMOGLOBIN: 11.1 g/dL (ref 11.1–15.9)
Immature Grans (Abs): 0 10*3/uL (ref 0.0–0.1)
Immature Granulocytes: 0 %
LYMPHS ABS: 1.3 10*3/uL (ref 0.7–3.1)
Lymphs: 15 %
MCH: 29.2 pg (ref 26.6–33.0)
MCHC: 32.3 g/dL (ref 31.5–35.7)
MCV: 91 fL (ref 79–97)
MONOCYTES: 12 %
Monocytes Absolute: 1 10*3/uL — ABNORMAL HIGH (ref 0.1–0.9)
Neutrophils Absolute: 6.2 10*3/uL (ref 1.4–7.0)
Neutrophils: 71 %
Platelets: 344 10*3/uL (ref 150–379)
RBC: 3.8 x10E6/uL (ref 3.77–5.28)
RDW: 13.2 % (ref 12.3–15.4)
WBC: 8.7 10*3/uL (ref 3.4–10.8)

## 2017-02-13 ENCOUNTER — Encounter: Payer: Self-pay | Admitting: Family Medicine

## 2017-02-26 ENCOUNTER — Other Ambulatory Visit: Payer: Self-pay | Admitting: Family Medicine

## 2017-02-26 DIAGNOSIS — E039 Hypothyroidism, unspecified: Secondary | ICD-10-CM

## 2017-03-06 ENCOUNTER — Other Ambulatory Visit: Payer: Self-pay | Admitting: Physician Assistant

## 2017-03-06 DIAGNOSIS — Z1231 Encounter for screening mammogram for malignant neoplasm of breast: Secondary | ICD-10-CM

## 2017-03-19 ENCOUNTER — Ambulatory Visit
Admission: RE | Admit: 2017-03-19 | Discharge: 2017-03-19 | Disposition: A | Discharge status: Home / Self Care | Payer: Medicare Other | Source: Ambulatory Visit | Attending: Physician Assistant | Admitting: Physician Assistant

## 2017-03-19 DIAGNOSIS — Z1231 Encounter for screening mammogram for malignant neoplasm of breast: Secondary | ICD-10-CM | POA: Diagnosis present

## 2017-03-20 ENCOUNTER — Telehealth: Payer: Self-pay

## 2017-03-20 NOTE — Telephone Encounter (Signed)
-----   Message from Mar Daring, PA-C sent at 03/20/2017  8:33 AM EDT ----- Normal mammogram. Repeat screening in one year.

## 2017-03-20 NOTE — Telephone Encounter (Signed)
Patient advised as below.  

## 2017-03-29 ENCOUNTER — Ambulatory Visit (INDEPENDENT_AMBULATORY_CARE_PROVIDER_SITE_OTHER): Payer: Medicare Other

## 2017-03-29 VITALS — BP 112/68 | HR 68 | Temp 98.7°F | Ht 66.0 in | Wt 124.8 lb

## 2017-03-29 DIAGNOSIS — Z0000 Encounter for general adult medical examination without abnormal findings: Secondary | ICD-10-CM

## 2017-03-29 NOTE — Progress Notes (Signed)
Subjective:   Janet Craig is a 71 y.o. female who presents for Medicare Annual (Subsequent) preventive examination.  Review of Systems:  N/A  Cardiac Risk Factors include: advanced age (>26men, >74 women)     Objective:     Vitals: BP 112/68 (BP Location: Left Arm)   Pulse 68   Temp 98.7 F (37.1 C) (Oral)   Ht 5\' 6"  (1.676 m)   Wt 124 lb 12.8 oz (56.6 kg)   BMI 20.14 kg/m   Body mass index is 20.14 kg/m.   Tobacco History  Smoking Status  . Never Smoker  Smokeless Tobacco  . Never Used     Counseling given: Not Answered   History reviewed. No pertinent past medical history. Past Surgical History:  Procedure Laterality Date  . ABDOMINAL HYSTERECTOMY    . APPENDECTOMY    . COLONOSCOPY  12/2005  . EYE SURGERY    . LUMBAR MICRODISCECTOMY  11/2010  . TOTAL HIP ARTHROPLASTY Left 02/24/13   Family History  Problem Relation Age of Onset  . Breast cancer Mother 8  . CVA Father   . Lung cancer Father   . Melanoma Father   . Multiple sclerosis Sister   . Lupus Brother   . Colon cancer Paternal Aunt   . Lung cancer Maternal Grandfather    History  Sexual Activity  . Sexual activity: Not on file    Outpatient Encounter Prescriptions as of 03/29/2017  Medication Sig  . levothyroxine (SYNTHROID, LEVOTHROID) 50 MCG tablet take 1 tablet by mouth once daily  . Multiple Vitamin tablet Take by mouth.  . Omega-3 Fatty Acids (FISH OIL MAXIMUM STRENGTH) 1200 MG CPDR Take 1,200 mg by mouth daily.   . [DISCONTINUED] ciprofloxacin (CIPRO) 500 MG tablet Take 1 tablet (500 mg total) by mouth 2 (two) times daily.  . [DISCONTINUED] meloxicam (MOBIC) 15 MG tablet Take 1 tablet (15 mg total) by mouth daily. (Patient not taking: Reported on 02/07/2017)  . [DISCONTINUED] methocarbamol (ROBAXIN) 500 MG tablet Take 1 tablet (500 mg total) by mouth every 8 (eight) hours as needed for muscle spasms. (Patient not taking: Reported on 02/07/2017)  . [DISCONTINUED] metroNIDAZOLE  (FLAGYL) 500 MG tablet Take 1 tablet (500 mg total) by mouth 2 (two) times daily.   No facility-administered encounter medications on file as of 03/29/2017.     Activities of Daily Living In your present state of health, do you have any difficulty performing the following activities: 03/29/2017  Hearing? N  Vision? N  Difficulty concentrating or making decisions? N  Walking or climbing stairs? N  Dressing or bathing? N  Doing errands, shopping? N  Preparing Food and eating ? N  Using the Toilet? N  In the past six months, have you accidently leaked urine? N  Do you have problems with loss of bowel control? N  Managing your Medications? N  Managing your Finances? N  Housekeeping or managing your Housekeeping? N  Some recent data might be hidden    Patient Care Team: Birdie Sons, MD as PCP - General (Family Medicine) Jerrol Banana., MD as Consulting Physician (Family Medicine) Carmon Ginsberg, [place] as Referring Physician (Family Medicine)    Assessment:     Exercise Activities and Dietary recommendations Current Exercise Habits: The patient does not participate in regular exercise at present (gardening), Exercise limited by: None identified  Goals    . water intake          Continue drinking 6-8 glasses of  water a day.       Fall Risk Fall Risk  03/29/2017 10/25/2015  Falls in the past year? No No   Depression Screen PHQ 2/9 Scores 03/29/2017 03/29/2017 10/25/2015  PHQ - 2 Score 0 0 0  PHQ- 9 Score 0 - -     Cognitive Function     6CIT Screen 03/29/2017  What Year? 0 points  What month? 0 points  What time? 0 points  Count back from 20 0 points  Months in reverse 0 points  Repeat phrase 0 points  Total Score 0    Immunization History  Administered Date(s) Administered  . Pneumococcal Conjugate-13 10/20/2014  . Pneumococcal Polysaccharide-23 10/25/2015   Screening Tests Health Maintenance  Topic Date Due  . TETANUS/TDAP  03/11/2027 (Originally  02/17/1966)  . INFLUENZA VACCINE  04/10/2017  . MAMMOGRAM  03/20/2019  . COLONOSCOPY  12/06/2024  . DEXA SCAN  Completed  . Hepatitis C Screening  Completed  . PNA vac Low Risk Adult  Completed      Plan:  I have personally reviewed and addressed the Medicare Annual Wellness questionnaire and have noted the following in the patient's chart:  A. Medical and social history B. Use of alcohol, tobacco or illicit drugs  C. Current medications and supplements D. Functional ability and status E.  Nutritional status F.  Physical activity G. Advance directives H. List of other physicians I.  Hospitalizations, surgeries, and ER visits in previous 12 months J.  [place] such as hearing and vision if needed, cognitive and depression L. Referrals and appointments - none  In addition, I have reviewed and discussed with patient certain preventive protocols, quality metrics, and best practice recommendations. A written personalized care plan for preventive services as well as general preventive health recommendations were provided to patient.  See attached scanned questionnaire for additional information.   Signed,  Fabio Neighbors, LPN Nurse Health Advisor   MD Recommendations: None. Pt declined tetanus vaccine today.

## 2017-03-29 NOTE — Patient Instructions (Signed)
Janet Craig , Thank you for taking time to come for your Medicare Wellness Visit. I appreciate your ongoing commitment to your health goals. Please review the following plan we discussed and let me know if I can assist you in the future.   Screening recommendations/referrals: Colonoscopy: completed 02/13/17 Mammogram: completed 03/20/17, due 03/2018 Bone Density: completed 10/27/14 Recommended yearly ophthalmology/optometry visit for glaucoma screening and checkup Recommended yearly dental visit for hygiene and checkup  Vaccinations: Influenza vaccine: due 05/2017 Pneumococcal vaccine: completed series Tdap vaccine: declined Shingles vaccine: declined   Advanced directives: Please bring a copy of your POA (Power of Attorney) and/or Living Will to your next appointment.   Conditions/risks identified: Continue drinking 6-8 glasses of water a day.   Next appointment: None, need to schedule follow up with PCP and 1 year AWV   Preventive Care 65 Years and Older, Female Preventive care refers to lifestyle choices and visits with your health care provider that can promote health and wellness. What does preventive care include?  A yearly physical exam. This is also called an annual well check.  Dental exams once or twice a year.  Routine eye exams. Ask your health care provider how often you should have your eyes checked.  Personal lifestyle choices, including:  Daily care of your teeth and gums.  Regular physical activity.  Eating a healthy diet.  Avoiding tobacco and drug use.  Limiting alcohol use.  Practicing safe sex.  Taking low-dose aspirin every day.  Taking vitamin and mineral supplements as recommended by your health care provider. What happens during an annual well check? The services and screenings done by your health care provider during your annual well check will depend on your age, overall health, lifestyle risk factors, and family history of  disease. Counseling  Your health care provider may ask you questions about your:  Alcohol use.  Tobacco use.  Drug use.  Emotional well-being.  Home and relationship well-being.  Sexual activity.  Eating habits.  History of falls.  Memory and ability to understand (cognition).  Work and work Statistician.  Reproductive health. Screening  You may have the following tests or measurements:  Height, weight, and BMI.  Blood pressure.  Lipid and cholesterol levels. These may be checked every 5 years, or more frequently if you are over 21 years old.  Skin check.  Lung cancer screening. You may have this screening every year starting at age 51 if you have a 30-pack-year history of smoking and currently smoke or have quit within the past 15 years.  Fecal occult blood test (FOBT) of the stool. You may have this test every year starting at age 3.  Flexible sigmoidoscopy or colonoscopy. You may have a sigmoidoscopy every 5 years or a colonoscopy every 10 years starting at age 30.  Hepatitis C blood test.  Hepatitis B blood test.  Sexually transmitted disease (STD) testing.  Diabetes screening. This is done by checking your blood sugar (glucose) after you have not eaten for a while (fasting). You may have this done every 1-3 years.  Bone density scan. This is done to screen for osteoporosis. You may have this done starting at age 6.  Mammogram. This may be done every 1-2 years. Talk to your health care provider about how often you should have regular mammograms. Talk with your health care provider about your test results, treatment options, and if necessary, the need for more tests. Vaccines  Your health care provider may recommend certain vaccines, such as:  Influenza vaccine. This is recommended every year.  Tetanus, diphtheria, and acellular pertussis (Tdap, Td) vaccine. You may need a Td booster every 10 years.  Zoster vaccine. You may need this after age  7.  Pneumococcal 13-valent conjugate (PCV13) vaccine. One dose is recommended after age 54.  Pneumococcal polysaccharide (PPSV23) vaccine. One dose is recommended after age 41. Talk to your health care provider about which screenings and vaccines you need and how often you need them. This information is not intended to replace advice given to you by your health care provider. Make sure you discuss any questions you have with your health care provider. Document Released: 09/23/2015 Document Revised: 05/16/2016 Document Reviewed: 06/28/2015 Elsevier Interactive Patient Education  2017 [place] Prevention in the Home Falls can cause injuries. They can happen to people of all ages. There are many things you can do to make your home safe and to help prevent falls. What can I do on the outside of my home?  Regularly fix the edges of walkways and driveways and fix any cracks.  Remove anything that might make you trip as you walk through a door, such as a raised step or threshold.  Trim any bushes or trees on the path to your home.  Use bright outdoor lighting.  Clear any walking paths of anything that might make someone trip, such as rocks or tools.  Regularly check to see if handrails are loose or broken. Make sure that both sides of any steps have handrails.  Any raised decks and porches should have guardrails on the edges.  Have any leaves, snow, or ice cleared regularly.  Use sand or salt on walking paths during winter.  Clean up any spills in your garage right away. This includes oil or grease spills. What can I do in the bathroom?  Use night lights.  Install grab bars by the toilet and in the tub and shower. Do not use towel bars as grab bars.  Use non-skid mats or decals in the tub or shower.  If you need to sit down in the shower, use a plastic, non-slip stool.  Keep the floor dry. Clean up any water that spills on the floor as soon as it happens.  Remove  soap buildup in the tub or shower regularly.  Attach bath mats securely with double-sided non-slip rug tape.  Do not have throw rugs and other things on the floor that can make you trip. What can I do in the bedroom?  Use night lights.  Make sure that you have a light by your bed that is easy to reach.  Do not use any sheets or blankets that are too big for your bed. They should not hang down onto the floor.  Have a firm chair that has side arms. You can use this for support while you get dressed.  Do not have throw rugs and other things on the floor that can make you trip. What can I do in the kitchen?  Clean up any spills right away.  Avoid walking on wet floors.  Keep items that you use a lot in easy-to-reach places.  If you need to reach something above you, use a strong step stool that has a grab bar.  Keep electrical cords out of the way.  Do not use floor polish or wax that makes floors slippery. If you must use wax, use non-skid floor wax.  Do not have throw rugs and other things on the floor that  can make you trip. What can I do with my stairs?  Do not leave any items on the stairs.  Make sure that there are handrails on both sides of the stairs and use them. Fix handrails that are broken or loose. Make sure that handrails are as long as the stairways.  Check any carpeting to make sure that it is firmly attached to the stairs. Fix any carpet that is loose or worn.  Avoid having throw rugs at the top or bottom of the stairs. If you do have throw rugs, attach them to the floor with carpet tape.  Make sure that you have a light switch at the top of the stairs and the bottom of the stairs. If you do not have them, ask someone to add them for you. What else can I do to help prevent falls?  Wear shoes that:  Do not have high heels.  Have rubber bottoms.  Are comfortable and fit you well.  Are closed at the toe. Do not wear sandals.  If you use a  stepladder:  Make sure that it is fully opened. Do not climb a closed stepladder.  Make sure that both sides of the stepladder are locked into place.  Ask someone to hold it for you, if possible.  Clearly mark and make sure that you can see:  Any grab bars or handrails.  First and last steps.  Where the edge of each step is.  Use tools that help you move around (mobility aids) if they are needed. These include:  Canes.  Walkers.  Scooters.  Crutches.  Turn on the lights when you go into a dark area. Replace any light bulbs as soon as they burn out.  Set up your furniture so you have a clear path. Avoid moving your furniture around.  If any of your floors are uneven, fix them.  If there are any pets around you, be aware of where they are.  Review your medicines with your doctor. Some medicines can make you feel dizzy. This can increase your chance of falling. Ask your doctor what other things that you can do to help prevent falls. This information is not intended to replace advice given to you by your health care provider. Make sure you discuss any questions you have with your health care provider. Document Released: 06/23/2009 Document Revised: 02/02/2016 Document Reviewed: 10/01/2014 Elsevier Interactive Patient Education  2017 [employer].

## 2017-04-05 ENCOUNTER — Ambulatory Visit (INDEPENDENT_AMBULATORY_CARE_PROVIDER_SITE_OTHER): Payer: Medicare Other | Admitting: Family Medicine

## 2017-04-05 ENCOUNTER — Encounter: Payer: Self-pay | Admitting: Family Medicine

## 2017-04-05 VITALS — BP 110/70 | HR 74 | Temp 98.0°F | Resp 16 | Ht 66.0 in | Wt 124.0 lb

## 2017-04-05 DIAGNOSIS — Z0000 Encounter for general adult medical examination without abnormal findings: Secondary | ICD-10-CM

## 2017-04-05 DIAGNOSIS — E785 Hyperlipidemia, unspecified: Secondary | ICD-10-CM | POA: Diagnosis not present

## 2017-04-05 DIAGNOSIS — E2839 Other primary ovarian failure: Secondary | ICD-10-CM | POA: Diagnosis not present

## 2017-04-05 DIAGNOSIS — E039 Hypothyroidism, unspecified: Secondary | ICD-10-CM

## 2017-04-05 DIAGNOSIS — R739 Hyperglycemia, unspecified: Secondary | ICD-10-CM

## 2017-04-05 NOTE — Patient Instructions (Addendum)
   The CDC recommends two doses of Shingrix separated by 2 to 6 months for adults age 70 years and older. I recommend checking with your pharmacy plan regarding coverage for this vaccine.    Please contact your eyecare professional to schedule a routine eye exam

## 2017-04-05 NOTE — Progress Notes (Signed)
Patient: Janet Craig Female    DOB: 02-03-1946   71 y.o.   MRN: [id number] Visit Date: 04/05/2017  Today's Provider: Lelon Huh, MD   Chief Complaint  Patient presents with  . Hypothyroidism  . Annual Exam   Subjective:    HPI  This is a previous patient of Dr. Venia Minks present today as new patient to me to establish care and follow up on chronic medical problems.   Patient presents today for yearly physical She feels well no complaints. 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 Is active, walks every day. No chest pain or dyspnea. No problems with sleep or mood. Eats low carbohydrate diet due to family history of diabetes.   Hypothyroidism, unspecified hypothyroidism type From 10/24/2016-no changes.  Lab Results  Component Value Date   TSH 0.588 12/23/2015  Is doing well with levothyroxine. No adverse effects, energy level is good. Takes consistently every day.  She does have history of mildly elevated blood sugars and cholesterol  Lab Results  Component Value Date   HGBA1C 6.1 (H) 10/28/2015    Lab Results  Component Value Date   CHOL 196 10/28/2015   HDL 50 10/28/2015   LDLCALC 127 (H) 10/28/2015   TRIG 94 10/28/2015       Allergies  Allergen Reactions  . Neomycin-Bacitracin Zn-Polymyx   . Penicillins   . Miconazole Nitrate Rash     Current Outpatient Prescriptions:  .  levothyroxine (SYNTHROID, LEVOTHROID) 50 MCG tablet, take 1 tablet by mouth once daily, Disp: 30 tablet, Rfl: 1 .  Multiple Vitamin tablet, Take by mouth., Disp: , Rfl:  .  Omega-3 Fatty Acids (FISH OIL MAXIMUM STRENGTH) 1200 MG CPDR, Take 1,200 mg by mouth daily. , Disp: , Rfl:   Review of Systems  Constitutional: Negative for appetite change, chills, fatigue and fever.  Respiratory: Negative for chest tightness and shortness of breath.   Cardiovascular: Negative for chest pain and palpitations.  Gastrointestinal: Negative for abdominal pain, nausea and vomiting.  Neurological: Negative  for dizziness and weakness.    Social History  Substance Use Topics  . Smoking status: Never Smoker  . Smokeless tobacco: Never Used  . Alcohol use No   Objective:   BP 110/70 (BP Location: Right Arm, Patient Position: Sitting, Cuff Size: Normal)   Pulse 74   Temp 98 F (36.7 C) (Oral)   Resp 16   Ht 5\' 6"  (1.676 m)   Wt 124 lb (56.2 kg)   SpO2 97%   BMI 20.01 kg/m  Vitals:   04/05/17 1353  BP: 110/70  Pulse: 74  Resp: 16  Temp: 98 F (36.7 C)  TempSrc: Oral  SpO2: 97%  Weight: 124 lb (56.2 kg)  Height: 5\' 6"  (1.676 m)     Physical Exam   General Appearance:    Alert, cooperative, no distress, appears stated age  Head:    Normocephalic, without obvious abnormality, atraumatic  Eyes:    PERRL, conjunctiva/corneas clear, EOM's intact, fundi    benign, both eyes  Ears:    Normal TM's and external ear canals, both ears  Nose:   Nares normal, septum midline, mucosa normal, no drainage    or sinus tenderness  Throat:   Lips, mucosa, and tongue normal; teeth and gums normal  Neck:   Supple, symmetrical, trachea midline, no adenopathy;    thyroid:  no enlargement/tenderness/nodules; no carotid   bruit or JVD  Back:     Symmetric, no curvature, ROM  normal, no CVA tenderness  Lungs:     Clear to auscultation bilaterally, respirations unlabored  Chest Wall:    No tenderness or deformity   Heart:    Regular rate and rhythm, S1 and S2 normal, no murmur, rub   or gallop  Breast Exam:    normal appearance, no masses or tenderness  Abdomen:     Soft, non-tender, bowel sounds active all four quadrants,    no masses, no organomegaly  Pelvic:    not indicated  Extremities:   Extremities normal, atraumatic, no cyanosis or edema  Pulses:   2+ and symmetric all extremities  Skin:   Skin color, texture, turgor normal, no rashes or lesions  Lymph nodes:   Cervical, supraclavicular, and axillary nodes normal  Neurologic:   CNII-XII intact, normal strength, sensation and reflexes      throughout       Assessment & Plan:     1. Annual physical exam Doing well with no limitations of ADLs   2. Blood glucose elevated On low glycemic diet.  - Hemoglobin A1c  3. Adult hypothyroidism Feels well on current dose of levothyroxine - T4 AND TSH  4. Hyperlipidemia, unspecified hyperlipidemia type Diet controlled.  - Lipid panel  5. Estrogen deficiency  - DG Bone Density; Future       Lelon Huh, MD  [facility]

## 2017-04-20 LAB — LIPID PANEL
Chol/HDL Ratio: 3.9 ratio (ref 0.0–4.4)
Cholesterol, Total: 200 mg/dL — ABNORMAL HIGH (ref 100–199)
HDL: 51 mg/dL (ref >39)
LDL Calculated: 135 mg/dL — ABNORMAL HIGH (ref 0–99)
TRIGLYCERIDES: 69 mg/dL (ref 0–149)
VLDL CHOLESTEROL CAL: 14 mg/dL (ref 5–40)

## 2017-04-20 LAB — HEMOGLOBIN A1C
ESTIMATED AVERAGE GLUCOSE: 120 mg/dL
HEMOGLOBIN A1C: 5.8 % — AB (ref 4.8–5.6)

## 2017-04-20 LAB — T4 AND TSH
T4, Total: 5.4 ug/dL (ref 4.5–12.0)
TSH: 2 u[IU]/mL (ref 0.450–4.500)

## 2017-05-31 ENCOUNTER — Other Ambulatory Visit: Payer: Self-pay | Admitting: Family Medicine

## 2017-05-31 DIAGNOSIS — E039 Hypothyroidism, unspecified: Secondary | ICD-10-CM

## 2017-07-20 ENCOUNTER — Encounter: Payer: Self-pay | Admitting: Physician Assistant

## 2017-07-20 ENCOUNTER — Ambulatory Visit (INDEPENDENT_AMBULATORY_CARE_PROVIDER_SITE_OTHER): Payer: Medicare Other | Admitting: Physician Assistant

## 2017-07-20 VITALS — BP 110/64 | HR 71 | Temp 98.0°F | Resp 16 | Wt 131.0 lb

## 2017-07-20 DIAGNOSIS — H4020X0 Unspecified primary angle-closure glaucoma, stage unspecified: Secondary | ICD-10-CM | POA: Diagnosis not present

## 2017-07-20 DIAGNOSIS — H109 Unspecified conjunctivitis: Secondary | ICD-10-CM

## 2017-07-20 MED ORDER — OFLOXACIN 0.3 % OP SOLN: 2.0000 [drp] | Freq: Four times a day (QID) | OPHTHALMIC | 0 refills | Status: AC

## 2017-07-20 NOTE — Progress Notes (Signed)
Patient: Janet Craig Female    DOB: 03-22-1946   71 y.o.   MRN: [id number] Visit Date: 07/20/2017  Today's Provider: Trinna Post, PA-C   Chief Complaint  Patient presents with  . Eye Problem   Subjective:    Janet Craig is a 71 y/o woman with history of bilateral narrow angle glaucoma with artificial lens repair by [employer] [facility] who presents today with right eye redness and gritty feeling. She says this happened within the last day or so. She has a gritty sensation in her eye, had to pry her eye open this morning. She has photophobia at baseline and says this is not worsened. Explicitly denies globe pain. Left eye is unaffected. She is a Pharmacist, hospital at [employer], unaware of anybody with similar illness.   Eye Problem   The right eye is affected. This is a new problem. The current episode started yesterday. The problem has been gradually worsening. There was no injury mechanism. The patient is experiencing no pain. Known exposure: unknown. She does not wear contacts. Associated symptoms include an eye discharge, eye redness, a foreign body sensation and itching. Pertinent negatives include no blurred vision, double vision, fever, nausea, photophobia, recent URI or vomiting. She has tried nothing for the symptoms.       Allergies  Allergen Reactions  . Neomycin-Bacitracin Zn-Polymyx   . Penicillins   . Miconazole Nitrate Rash     Current Outpatient Medications:  .  levothyroxine (SYNTHROID, LEVOTHROID) 50 MCG tablet, take 1 tablet by mouth once daily, Disp: 30 tablet, Rfl: 11 .  Multiple Vitamin tablet, Take by mouth., Disp: , Rfl:  .  Omega-3 Fatty Acids (FISH OIL MAXIMUM STRENGTH) 1200 MG CPDR, Take 1,200 mg by mouth daily. , Disp: , Rfl:   Review of Systems  Constitutional: Negative for fever.  Eyes: Positive for discharge, redness and itching. Negative for blurred vision, double vision and photophobia.  Gastrointestinal: Negative for nausea and vomiting.    Social  History   Tobacco Use  . Smoking status: Never Smoker  . Smokeless tobacco: Never Used  Substance Use Topics  . Alcohol use: No   Objective:   BP 110/64 (BP Location: Right Arm, Patient Position: Sitting, Cuff Size: Normal)   Pulse 71   Temp 98 F (36.7 C) (Oral)   Resp 16   Wt 131 lb (59.4 kg)   SpO2 95%   BMI 21.14 kg/m  Vitals:   07/20/17 1006  BP: 110/64  Pulse: 71  Resp: 16  Temp: 98 F (36.7 C)  TempSrc: Oral  SpO2: 95%  Weight: 131 lb (59.4 kg)     Physical Exam  Eyes: EOM are normal. Pupils are equal, round, and reactive to light. Lids are everted and swept, no foreign bodies found. Right eye exhibits discharge and exudate. Right eye exhibits no chemosis and no hordeolum. No foreign body present in the right eye. Left eye exhibits no discharge. Right conjunctiva is injected. Right conjunctiva has no hemorrhage. Left conjunctiva is not injected. Left conjunctiva has no hemorrhage.        Assessment & Plan:     1. Primary angle closure glaucoma of both eyes, unspecified glaucoma stage, unspecified primary angle-closure glaucoma type  I have placed a call to [place] on-call physician due to her history of narrow angle glaucoma repair by Dr. Michelene Heady. Spoke with Dr. Benay Pillow about patient and her presentation. He recommended that as long as her presentation was  mainly conjunctival redness and discharge, that abx eyedrops or Maxitrol was appropriate. If she progressed to pain or severe photophobia, she would have to be seen by ophthalmologist. I have conveyed this to patient. Any worsening, she must be seen.   2. Bacterial conjunctivitis of right eye  - ofloxacin (OCUFLOX) 0.3 % ophthalmic solution; Place 2 drops 4 (four) times daily for 7 days into the right eye.  Dispense: 2.8 mL; Refill: 0  No Follow-up on file.  The entirety of the information documented in the History of Present Illness, Review of Systems and Physical Exam were personally obtained by  me. Portions of this information were initially documented by Ashley Royalty, CMA and reviewed by me for thoroughness and accuracy.         Trinna Post, PA-C  [facility]

## 2017-07-20 NOTE — Patient Instructions (Signed)

## 2017-07-22 ENCOUNTER — Ambulatory Visit: Payer: Medicare Other | Admitting: Family Medicine

## 2017-07-22 VITALS — BP 102/54 | HR 68 | Temp 97.9°F | Resp 16 | Wt 129.0 lb

## 2017-07-22 DIAGNOSIS — L03213 Periorbital cellulitis: Secondary | ICD-10-CM | POA: Diagnosis not present

## 2017-07-22 MED ORDER — SULFAMETHOXAZOLE-TRIMETHOPRIM 800-160 MG PO TABS: 1.0000 | ORAL_TABLET | Freq: Two times a day (BID) | ORAL | 0 refills | Status: DC

## 2017-07-22 NOTE — Assessment & Plan Note (Signed)
Conjunctivitis improving per report Appears to have periorbital cellulitis on exam No occular involvement with normal EOM, no eye pain Will treat with 7 day course of Bactrim Return/emergency precautions discussed F/u in 1 week to ensure resolution

## 2017-07-22 NOTE — Progress Notes (Signed)
Patient: Janet Craig Female    DOB: 03-28-1946   71 y.o.   MRN: [id number] Visit Date: 07/22/2017  Today's Provider: Lavon Paganini, MD   Chief Complaint  Patient presents with  . Eye Problem   Subjective:    HPI   Janet Craig was seen Saturday, 07/20/2017 for this problem by Carles Collet, PA-C. Pt was started on Ofloxacin 0.3% ophthalmic drops for bacterial conjunctivitis of right eye. She states she is about 30-35% improved, but wanted to be reevaluated and ask if she is still "contagious". She is still c/o itchiness, redness, and discharge of the eye. Denies photophobia, pain and visual disturbance.   Allergies  Allergen Reactions  . Neomycin-Bacitracin Zn-Polymyx   . Penicillins   . Miconazole Nitrate Rash     Current Outpatient Medications:  .  levothyroxine (SYNTHROID, LEVOTHROID) 50 MCG tablet, take 1 tablet by mouth once daily, Disp: 30 tablet, Rfl: 11 .  Multiple Vitamin tablet, Take by mouth., Disp: , Rfl:  .  ofloxacin (OCUFLOX) 0.3 % ophthalmic solution, Place 2 drops 4 (four) times daily for 7 days into the right eye., Disp: 2.8 mL, Rfl: 0 .  Omega-3 Fatty Acids (FISH OIL MAXIMUM STRENGTH) 1200 MG CPDR, Take 1,200 mg by mouth daily. , Disp: , Rfl:  .  FLUZONE HIGH-DOSE 0.5 ML injection, inject 0.5 milliliter intramuscularly, Disp: , Rfl: 0  Review of Systems  Constitutional: Negative for activity change, appetite change, chills, diaphoresis, fatigue, fever and unexpected weight change.  Eyes: Positive for discharge, redness and itching ("scratchy"). Negative for photophobia, pain and visual disturbance.    Social History   Tobacco Use  . Smoking status: Never Smoker  . Smokeless tobacco: Never Used  Substance Use Topics  . Alcohol use: No   Objective:   BP (!) 102/54 (BP Location: Left Arm, Patient Position: Sitting, Cuff Size: Normal)   Pulse 68   Temp 97.9 F (36.6 C) (Oral)   Resp 16   Wt 129 lb (58.5 kg)   BMI 20.82 kg/m  Vitals:    07/22/17 1334  BP: (!) 102/54  Pulse: 68  Resp: 16  Temp: 97.9 F (36.6 C)  TempSrc: Oral  Weight: 129 lb (58.5 kg)     Physical Exam  Constitutional: She is oriented to person, place, and time. She appears well-developed and well-nourished. No distress.  HENT:  Head: Normocephalic and atraumatic.  Right Ear: External ear normal.  Left Ear: External ear normal.  Eyes: Pupils are equal, round, and reactive to light. Right eye exhibits no discharge and no exudate. Left eye exhibits no discharge and no exudate. Right conjunctiva is injected. Right conjunctiva has no hemorrhage. Left conjunctiva is not injected. Left conjunctiva has no hemorrhage. Right eye exhibits normal extraocular motion. Left eye exhibits normal extraocular motion.  Cardiovascular: Normal rate and regular rhythm.  Pulmonary/Chest: Effort normal. No respiratory distress.  Neurological: She is alert and oriented to person, place, and time.  Skin:  Redness and swelling around R eye, especially lower lid, mildly TTP  Psychiatric: She has a normal mood and affect. Her behavior is normal.  Vitals reviewed.       Assessment & Plan:      Problem List Items Addressed This Visit      Other   Periorbital cellulitis of right eye - Primary    Conjunctivitis improving per report Appears to have periorbital cellulitis on exam No occular involvement with normal EOM, no eye pain Will treat  with 7 day course of Bactrim Return/emergency precautions discussed F/u in 1 week to ensure resolution             The entirety of the information documented in the History of Present Illness, Review of Systems and Physical Exam were personally obtained by me. Portions of this information were initially documented by Raquel Sarna Ratchford, CMA and reviewed by me for thoroughness and accuracy.     Lavon Paganini, MD  [facility]

## 2017-07-22 NOTE — Patient Instructions (Signed)
Preseptal Cellulitis, Adult Preseptal cellulitis-also called periorbital cellulitis-is an infection that can affect your eyelid and the soft tissues or skin that surround your eye. The infection may also affect the structures that produce and drain your tears. It does not affect your eye itself. What are the causes? This condition may be caused by:  Bacterial infection.  Long-term (chronic) sinus infections.  An object (foreign body) that is stuck behind the eye.  An injury that: ? Goes through the eyelid tissues. ? Causes an infection, such as an insect sting.  Fracture of the bone around the eye.  Infections that have spread from the eyelid or other structures around the eye.  Bite wounds.  Inflammation or infection of the lining membranes of the brain (meningitis).  An infection in the blood (septicemia).  Dental infection (abscess).  Viral infection. This is rare.  What increases the risk? Risk factors for preseptal cellulitis include:  Participating in activities that increase your risk of trauma to the face or head, such as boxing or high-speed activities.  Having a weakened defense system (immune system).  Medical conditions, such as nasal polyps, that increase your risk for frequent or recurrent sinus infections.  Not receiving regular dental care.  What are the signs or symptoms? Symptoms of this condition usually come on suddenly. Symptoms may include:  Red, hot, and swollen eyelids.  Fever.  Difficulty opening your eye.  Eye pain.  How is this diagnosed? This condition may be diagnosed by an eye exam. You may also have tests, such as:  Blood tests.  CT scan.  MRI.  Spinal tap (lumbar puncture). This is a procedure that involves removing and examining a small amount of the fluid that surrounds the brain and spinal cord. This checks for meningitis.  How is this treated? Treatment for this condition will include antibiotic medicines. These may be  given by mouth (orally), through an IV, or as a shot. Your health care provider may also recommend nasal decongestants to reduce swelling. Follow these instructions at home:  Take your antibiotic medicine as directed by your health care provider. Finish all of it even if you start to feel better.  Take medicines only as directed by your health care provider.  Drink enough fluid to keep your urine clear or pale yellow.  Do not use any tobacco products, including cigarettes, chewing tobacco, or electronic cigarettes. If you need help quitting, ask your health care provider.  Keep all follow-up visits as directed by your health care provider. These include any visits with an eye specialist (ophthalmologist) or dentist. Contact a health care provider if:  You have a fever.  Your eyelids become more red, warm, or swollen.  You have new symptoms.  Your symptoms do not get better with treatment. Get help right away if:  You develop double vision, or your vision becomes blurred or worsens in any way.  You have trouble moving your eyes.  Your eye looks like it is sticking out or bulging out (proptosis).  You develop a severe headache, severe neck pain, or neck stiffness.  You develop repeated vomiting. This information is not intended to replace advice given to you by your health care provider. Make sure you discuss any questions you have with your health care provider. Document Released: 09/29/2010 Document Revised: 02/02/2016 Document Reviewed: 08/23/2014 Elsevier Interactive Patient Education  2018 Elsevier Inc.  

## 2017-07-29 ENCOUNTER — Ambulatory Visit: Payer: Medicare Other | Admitting: Family Medicine

## 2017-07-29 VITALS — BP 100/64 | HR 68 | Temp 97.9°F | Resp 16 | Wt 129.0 lb

## 2017-07-29 DIAGNOSIS — L03213 Periorbital cellulitis: Secondary | ICD-10-CM

## 2017-07-29 NOTE — Progress Notes (Signed)
Patient: Janet Craig Female    DOB: [birth year]   71 y.o.   MRN: [id number] Visit Date: 07/29/2017  Today's Provider: Lavon Paganini, MD   Chief Complaint  Patient presents with  . Cellulitis   Subjective:    HPI     Follow up for Periorbital Cellulitis of Right Eye  The patient was last seen for this 1 week ago. Changes made at last visit include adding Bactrim.  She reports excellent compliance with treatment. She feels that condition is Improved. She is still c/o some swelling, but otherwise "much, much better".  ------------------------------------------------------------------------------------    Allergies  Allergen Reactions  . Neomycin-Bacitracin Zn-Polymyx   . Penicillins   . Miconazole Nitrate Rash     Current Outpatient Medications:  .  levothyroxine (SYNTHROID, LEVOTHROID) 50 MCG tablet, take 1 tablet by mouth once daily, Disp: 30 tablet, Rfl: 11 .  Multiple Vitamin tablet, Take by mouth., Disp: , Rfl:  .  Omega-3 Fatty Acids (FISH OIL MAXIMUM STRENGTH) 1200 MG CPDR, Take 1,200 mg by mouth daily. , Disp: , Rfl:  .  FLUZONE HIGH-DOSE 0.5 ML injection, inject 0.5 milliliter intramuscularly, Disp: , Rfl: 0  Review of Systems  Constitutional: Negative for activity change, appetite change, chills, diaphoresis, fatigue, fever and unexpected weight change.  Eyes: Negative for photophobia, pain, discharge, redness, itching and visual disturbance.  Respiratory: Negative for cough and shortness of breath.   Cardiovascular: Negative for chest pain, palpitations and leg swelling.    Social History   Tobacco Use  . Smoking status: Never Smoker  . Smokeless tobacco: Never Used  Substance Use Topics  . Alcohol use: No   Objective:   BP 100/64 (BP Location: Left Arm, Patient Position: Sitting, Cuff Size: Normal)   Pulse 68   Temp 97.9 F (36.6 C) (Oral)   Resp 16   Wt 129 lb (58.5 kg)   BMI 20.82 kg/m  Vitals:   07/29/17 0809  BP: 100/64    Pulse: 68  Resp: 16  Temp: 97.9 F (36.6 C)  TempSrc: Oral  Weight: 129 lb (58.5 kg)     Physical Exam  Constitutional: She is oriented to person, place, and time. She appears well-developed and well-nourished. No distress.  HENT:  Head: Normocephalic and atraumatic.  Eyes: Conjunctivae and EOM are normal. Pupils are equal, round, and reactive to light. No scleral icterus.  Cardiovascular: Normal rate, regular rhythm and normal heart sounds.  No murmur heard. Pulmonary/Chest: Effort normal and breath sounds normal. No respiratory distress. She has no wheezes. She has no rales.  Neurological: She is alert and oriented to person, place, and time.  Skin: Skin is warm and dry. No rash noted.  No periorbital redness  Psychiatric: She has a normal mood and affect. Her behavior is normal.  Vitals reviewed.       Assessment & Plan:      Problem List Items Addressed This Visit      Other   Periorbital cellulitis of right eye - Primary    Resolved Finished last dose of Bactrim this AM No red flags for intraocular pathology F/u prn         Return if symptoms worsen or fail to improve.     The entirety of the information documented in the History of Present Illness, Review of Systems and Physical Exam were personally obtained by me. Portions of this information were initially documented by Martha Clan, CMA and reviewed by me for thoroughness  and accuracy.     Lavon Paganini, MD  [facility]

## 2017-07-29 NOTE — Assessment & Plan Note (Signed)
Resolved Finished last dose of Bactrim this AM No red flags for intraocular pathology F/u prn

## 2017-11-13 ENCOUNTER — Other Ambulatory Visit: Payer: Self-pay | Admitting: Family Medicine

## 2017-11-13 ENCOUNTER — Other Ambulatory Visit: Payer: Self-pay

## 2017-11-13 DIAGNOSIS — E039 Hypothyroidism, unspecified: Secondary | ICD-10-CM

## 2017-11-13 NOTE — Telephone Encounter (Signed)
Pt needs refill of Levothyroxine. Please advise.

## 2017-11-14 MED ORDER — LEVOTHYROXINE SODIUM 50 MCG PO TABS: 50.0000 ug | ORAL_TABLET | Freq: Every day | ORAL | 11 refills | Status: DC

## 2018-03-31 ENCOUNTER — Ambulatory Visit (INDEPENDENT_AMBULATORY_CARE_PROVIDER_SITE_OTHER): Payer: Medicare Other

## 2018-03-31 ENCOUNTER — Other Ambulatory Visit: Payer: Self-pay | Admitting: Family Medicine

## 2018-03-31 VITALS — BP 100/56 | HR 72 | Temp 98.8°F | Ht 66.0 in | Wt 134.8 lb

## 2018-03-31 DIAGNOSIS — Z0000 Encounter for general adult medical examination without abnormal findings: Secondary | ICD-10-CM

## 2018-03-31 DIAGNOSIS — Z1231 Encounter for screening mammogram for malignant neoplasm of breast: Secondary | ICD-10-CM

## 2018-03-31 NOTE — Patient Instructions (Signed)
Janet Craig , Thank you for taking time to come for your Medicare Wellness Visit. I appreciate your ongoing commitment to your health goals. Please review the following plan we discussed and let me know if I can assist you in the future.   Screening recommendations/referrals: Colonoscopy: Up to date Mammogram: Up to date Bone Density: Up to date Recommended yearly ophthalmology/optometry visit for glaucoma screening and checkup Recommended yearly dental visit for hygiene and checkup  Vaccinations: Influenza vaccine: Up to date Pneumococcal vaccine: Up to date Tdap vaccine: Up to date Shingles vaccine: Pt declines today.     Advanced directives: Please bring a copy of your POA (Power of Attorney) and/or Living Will to your next appointment.   Conditions/risks identified: Recommend eating 3 small meals a day with two healthy snacks in between.   Next appointment: 04/02/18 @ 3 PM with Dr Brita Romp. Pt declined scheduling the AWV for 2020.    Preventive Care 44 Years and Older, Female Preventive care refers to lifestyle choices and visits with your health care provider that can promote health and wellness. What does preventive care include?  A yearly physical exam. This is also called an annual well check.  Dental exams once or twice a year.  Routine eye exams. Ask your health care provider how often you should have your eyes checked.  Personal lifestyle choices, including:  Daily care of your teeth and gums.  Regular physical activity.  Eating a healthy diet.  Avoiding tobacco and drug use.  Limiting alcohol use.  Practicing safe sex.  Taking low-dose aspirin every day.  Taking vitamin and mineral supplements as recommended by your health care provider. What happens during an annual well check? The services and screenings done by your health care provider during your annual well check will depend on your age, overall health, lifestyle risk factors, and family history  of disease. Counseling  Your health care provider may ask you questions about your:  Alcohol use.  Tobacco use.  Drug use.  Emotional well-being.  Home and relationship well-being.  Sexual activity.  Eating habits.  History of falls.  Memory and ability to understand (cognition).  Work and work Statistician.  Reproductive health. Screening  You may have the following tests or measurements:  Height, weight, and BMI.  Blood pressure.  Lipid and cholesterol levels. These may be checked every 5 years, or more frequently if you are over 68 years old.  Skin check.  Lung cancer screening. You may have this screening every year starting at age 32 if you have a 30-pack-year history of smoking and currently smoke or have quit within the past 15 years.  Fecal occult blood test (FOBT) of the stool. You may have this test every year starting at age 7.  Flexible sigmoidoscopy or colonoscopy. You may have a sigmoidoscopy every 5 years or a colonoscopy every 10 years starting at age 27.  Hepatitis C blood test.  Hepatitis B blood test.  Sexually transmitted disease (STD) testing.  Diabetes screening. This is done by checking your blood sugar (glucose) after you have not eaten for a while (fasting). You may have this done every 1-3 years.  Bone density scan. This is done to screen for osteoporosis. You may have this done starting at age 67.  Mammogram. This may be done every 1-2 years. Talk to your health care provider about how often you should have regular mammograms. Talk with your health care provider about your test results, treatment options, and if necessary, the  need for more tests. Vaccines  Your health care provider may recommend certain vaccines, such as:  Influenza vaccine. This is recommended every year.  Tetanus, diphtheria, and acellular pertussis (Tdap, Td) vaccine. You may need a Td booster every 10 years.  Zoster vaccine. You may need this after age  25.  Pneumococcal 13-valent conjugate (PCV13) vaccine. One dose is recommended after age 61.  Pneumococcal polysaccharide (PPSV23) vaccine. One dose is recommended after age 7. Talk to your health care provider about which screenings and vaccines you need and how often you need them. This information is not intended to replace advice given to you by your health care provider. Make sure you discuss any questions you have with your health care provider. Document Released: 09/23/2015 Document Revised: 05/16/2016 Document Reviewed: 06/28/2015 Elsevier Interactive Patient Education  2017 [employer] Prevention in the Home Falls can cause injuries. They can happen to people of all ages. There are many things you can do to make your home safe and to help prevent falls. What can I do on the outside of my home?  Regularly fix the edges of walkways and driveways and fix any cracks.  Remove anything that might make you trip as you walk through a door, such as a raised step or threshold.  Trim any bushes or trees on the path to your home.  Use bright outdoor lighting.  Clear any walking paths of anything that might make someone trip, such as rocks or tools.  Regularly check to see if handrails are loose or broken. Make sure that both sides of any steps have handrails.  Any raised decks and porches should have guardrails on the edges.  Have any leaves, snow, or ice cleared regularly.  Use sand or salt on walking paths during winter.  Clean up any spills in your garage right away. This includes oil or grease spills. What can I do in the bathroom?  Use night lights.  Install grab bars by the toilet and in the tub and shower. Do not use towel bars as grab bars.  Use non-skid mats or decals in the tub or shower.  If you need to sit down in the shower, use a plastic, non-slip stool.  Keep the floor dry. Clean up any water that spills on the floor as soon as it happens.  Remove  soap buildup in the tub or shower regularly.  Attach bath mats securely with double-sided non-slip rug tape.  Do not have throw rugs and other things on the floor that can make you trip. What can I do in the bedroom?  Use night lights.  Make sure that you have a light by your bed that is easy to reach.  Do not use any sheets or blankets that are too big for your bed. They should not hang down onto the floor.  Have a firm chair that has side arms. You can use this for support while you get dressed.  Do not have throw rugs and other things on the floor that can make you trip. What can I do in the kitchen?  Clean up any spills right away.  Avoid walking on wet floors.  Keep items that you use a lot in easy-to-reach places.  If you need to reach something above you, use a strong step stool that has a grab bar.  Keep electrical cords out of the way.  Do not use floor polish or wax that makes floors slippery. If you must use wax,  use non-skid floor wax.  Do not have throw rugs and other things on the floor that can make you trip. What can I do with my stairs?  Do not leave any items on the stairs.  Make sure that there are handrails on both sides of the stairs and use them. Fix handrails that are broken or loose. Make sure that handrails are as [place] as the stairways.  Check any carpeting to make sure that it is firmly attached to the stairs. Fix any carpet that is loose or worn.  Avoid having throw rugs at the top or bottom of the stairs. If you do have throw rugs, attach them to the floor with carpet tape.  Make sure that you have a light switch at the top of the stairs and the bottom of the stairs. If you do not have them, ask someone to add them for you. What else can I do to help prevent falls?  Wear shoes that:  Do not have high heels.  Have rubber bottoms.  Are comfortable and fit you well.  Are closed at the toe. Do not wear sandals.  If you use a  stepladder:  Make sure that it is fully opened. Do not climb a closed stepladder.  Make sure that both sides of the stepladder are locked into place.  Ask someone to hold it for you, if possible.  Clearly mark and make sure that you can see:  Any grab bars or handrails.  First and last steps.  Where the edge of each step is.  Use tools that help you move around (mobility aids) if they are needed. These include:  Canes.  Walkers.  Scooters.  Crutches.  Turn on the lights when you go into a dark area. Replace any light bulbs as soon as they burn out.  Set up your furniture so you have a clear path. Avoid moving your furniture around.  If any of your floors are uneven, fix them.  If there are any pets around you, be aware of where they are.  Review your medicines with your doctor. Some medicines can make you feel dizzy. This can increase your chance of falling. Ask your doctor what other things that you can do to help prevent falls. This information is not intended to replace advice given to you by your health care provider. Make sure you discuss any questions you have with your health care provider. Document Released: 06/23/2009 Document Revised: 02/02/2016 Document Reviewed: 10/01/2014 Elsevier Interactive Patient Education  2017 [employer].

## 2018-03-31 NOTE — Progress Notes (Signed)
Subjective:   Janet Craig is a 72 y.o. female who presents for Medicare Annual (Subsequent) preventive examination.  Review of Systems:  N/A   Cardiac Risk Factors include: advanced age (>80men, >5 women);dyslipidemia     Objective:     Vitals: BP (!) 100/56 (BP Location: Right Arm)   Pulse 72   Temp 98.8 F (37.1 C) (Oral)   Ht 5\' 6"  (1.676 m)   Wt 134 lb 12.8 oz (61.1 kg)   BMI 21.76 kg/m   Body mass index is 21.76 kg/m.  Advanced Directives 03/31/2018 03/29/2017  Does Patient Have a Medical Advance Directive? Yes Yes  Type of Paramedic of [place];Living will [place];Living will  Copy of [place] in Chart? No - copy requested No - copy requested    Tobacco Social History   Tobacco Use  Smoking Status Never Smoker  Smokeless Tobacco Never Used     Counseling given: Not Answered   Clinical Intake:  Pre-visit preparation completed: Yes  Pain : No/denies pain Pain Score: 0-No pain     Nutritional Status: BMI of 19-24  Normal Nutritional Risks: None Diabetes: No  How often do you need to have someone help you when you read instructions, pamphlets, or other written materials from your doctor or pharmacy?: 1 - Never  Interpreter Needed?: No  Information entered by :: [facility], LPN  History reviewed. No pertinent past medical history. Past Surgical History:  Procedure Laterality Date  . ABDOMINAL HYSTERECTOMY    . APPENDECTOMY    . COLONOSCOPY  12/2005  . EYE SURGERY    . LUMBAR MICRODISCECTOMY  11/2010  . TOTAL HIP ARTHROPLASTY Left 02/24/13   Family History  Problem Relation Age of Onset  . Breast cancer Mother 10  . CVA Father   . Lung cancer Father   . Melanoma Father   . Multiple sclerosis Sister   . Lupus Brother   . Colon cancer Paternal Aunt   . Lung cancer Maternal Grandfather    Social History   Socioeconomic History  . Marital status: Married    Spouse name:  Not on file  . Number of children: 1  . Years of education: Not on file  . Highest education level: Master's degree (e.g., MA, MS, MEng, MEd, MSW, MBA)  Occupational History  . 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 Financial resource strain: Not hard at all  . Food insecurity:    Worry: Never true    Inability: Never true  . Transportation needs:    Medical: No    Non-medical: No  Tobacco Use  . Smoking status: Never Smoker  . Smokeless tobacco: Never Used  Substance and Sexual Activity  . Alcohol use: No  . Drug use: No  . Sexual activity: Not on file  Lifestyle  . Physical activity:    Days per week: Not on file    Minutes per session: Not on file  . Stress: Not at all  Relationships  . Social connections:    Talks on phone: Not on file    Gets together: Not on file    Attends religious service: Not on file    Active member of club or organization: Not on file    Attends meetings of clubs or organizations: Not on file    Relationship status: Not on file  Other Topics Concern  . Not on file  Social History  Narrative  . Not on file    Outpatient Encounter Medications as of 03/31/2018  Medication Sig  . levothyroxine (SYNTHROID, LEVOTHROID) 50 MCG tablet Take 1 tablet (50 mcg total) by mouth daily.  . Multiple Vitamin tablet Take 1 tablet by mouth daily.   . Omega-3 Fatty Acids (FISH OIL MAXIMUM STRENGTH) 1200 MG CPDR Take 1,200 mg by mouth daily.   Marland Kitchen FLUZONE HIGH-DOSE 0.5 ML injection inject 0.5 milliliter intramuscularly   No facility-administered encounter medications on file as of 03/31/2018.     Activities of Daily Living In your present state of health, do you have any difficulty performing the following activities: 03/31/2018  Hearing? N  Vision? N  Difficulty concentrating or making decisions? N  Walking or climbing stairs? N  Dressing or bathing? N  Doing errands, shopping? N  Preparing Food and eating ? N  Using the Toilet?  N  In the past six months, have you accidently leaked urine? N  Do you have problems with loss of bowel control? N  Managing your Medications? N  Managing your Finances? N  Housekeeping or managing your Housekeeping? N  Some recent data might be hidden    Patient Care Team: Birdie Sons, MD as PCP - General (Family Medicine) Carmon Ginsberg, [place] as Referring Physician (Family Medicine) Birder Robson, MD as Referring Physician (Ophthalmology) Brita Romp Dionne Bucy, MD as Consulting Physician (Family Medicine)    Assessment:   This is a routine wellness examination for Janet Craig.  Exercise Activities and Dietary recommendations Current Exercise Habits: Home exercise routine, Type of exercise: walking, Time (Minutes): 45, Intensity: Mild, Exercise limited by: None identified  Goals    None      Fall Risk Fall Risk  03/31/2018 03/29/2017 10/25/2015  Falls in the past year? No No No   Is the patient's home free of loose throw rugs in walkways, pet beds, electrical cords, etc?   yes      Grab bars in the bathroom? no      Handrails on the stairs?   yes      Adequate lighting?   yes  Timed Get Up and Go performed: N/A  Depression Screen PHQ 2/9 Scores 03/31/2018 03/29/2017 03/29/2017 10/25/2015  PHQ - 2 Score 0 0 0 0  PHQ- 9 Score - 0 - -     Cognitive Function: Pt declined screening today.      6CIT Screen 03/29/2017  What Year? 0 points  What month? 0 points  What time? 0 points  Count back from 20 0 points  Months in reverse 0 points  Repeat phrase 0 points  Total Score 0    Immunization History  Administered Date(s) Administered  . Influenza, High Dose Seasonal PF 07/09/2017  . Pneumococcal Conjugate-13 10/20/2014  . Pneumococcal Polysaccharide-23 10/25/2015    Qualifies for Shingles Vaccine? Due for Shingles vaccine. Declined my offer to administer today. Education has been provided regarding the importance of this vaccine. Pt has been advised to call her  insurance company to determine her out of pocket expense. Advised she may also receive this vaccine at her local pharmacy or Health Dept. Verbalized acceptance and understanding.  Screening Tests Health Maintenance  Topic Date Due  . TETANUS/TDAP  03/11/2027 (Originally 02/17/1965)  . INFLUENZA VACCINE  04/10/2018  . MAMMOGRAM  03/20/2019  . COLONOSCOPY  12/06/2024  . DEXA SCAN  Completed  . Hepatitis C Screening  Completed  . PNA vac Low Risk Adult  Completed    Cancer  Screenings: Lung: Low Dose CT Chest recommended if Age 16-80 years, 30 pack-year currently smoking OR have quit w/in 15years. Patient does not qualify. Breast:  Up to date on Mammogram? Yes   Up to date of Bone Density/Dexa? Yes Colorectal: Up to date  Additional Screenings:  Hepatitis C Screening: Up to date     Plan:  I have personally reviewed and addressed the Medicare Annual Wellness questionnaire and have noted the following in the patient's chart:  A. Medical and social history B. Use of alcohol, tobacco or illicit drugs  C. Current medications and supplements D. Functional ability and status E.  Nutritional status F.  Physical activity G. Advance directives H. List of other physicians I.  Hospitalizations, surgeries, and ER visits in previous 12 months J.  [place] such as hearing and vision if needed, cognitive and depression L. Referrals and appointments - none  In addition, I have reviewed and discussed with patient certain preventive protocols, quality metrics, and best practice recommendations. A written personalized care plan for preventive services as well as general preventive health recommendations were provided to patient.  See attached scanned questionnaire for additional information.   Signed,  Fabio Neighbors, LPN Nurse Health Advisor   Nurse Recommendations: None.

## 2018-04-02 ENCOUNTER — Ambulatory Visit (INDEPENDENT_AMBULATORY_CARE_PROVIDER_SITE_OTHER): Payer: Medicare Other | Admitting: Family Medicine

## 2018-04-02 ENCOUNTER — Encounter: Payer: Self-pay | Admitting: Family Medicine

## 2018-04-02 VITALS — BP 102/66 | HR 76 | Temp 98.2°F | Resp 16 | Ht 66.0 in | Wt 132.0 lb

## 2018-04-02 DIAGNOSIS — E039 Hypothyroidism, unspecified: Secondary | ICD-10-CM | POA: Diagnosis not present

## 2018-04-02 DIAGNOSIS — E785 Hyperlipidemia, unspecified: Secondary | ICD-10-CM | POA: Diagnosis not present

## 2018-04-02 DIAGNOSIS — Z0000 Encounter for general adult medical examination without abnormal findings: Secondary | ICD-10-CM

## 2018-04-02 DIAGNOSIS — R739 Hyperglycemia, unspecified: Secondary | ICD-10-CM | POA: Diagnosis not present

## 2018-04-02 DIAGNOSIS — M8580 Other specified disorders of bone density and structure, unspecified site: Secondary | ICD-10-CM

## 2018-04-02 DIAGNOSIS — Z780 Asymptomatic menopausal state: Secondary | ICD-10-CM

## 2018-04-02 NOTE — Patient Instructions (Addendum)
The CDC recommends two doses of Shingrix (the shingles vaccine) separated by 2 to 6 months for adults age 71 years and older. I recommend checking with your insurance plan regarding coverage for this vaccine.   Call the breast center about scheduling bone density with mammogram  Come back some morning for fasting labs after 04/19/18   Preventive Care 65 Years and Older, Female Preventive care refers to lifestyle choices and visits with your health care provider that can promote health and wellness. What does preventive care include?  A yearly physical exam. This is also called an annual well check.  Dental exams once or twice a year.  Routine eye exams. Ask your health care provider how often you should have your eyes checked.  Personal lifestyle choices, including: ? Daily care of your teeth and gums. ? Regular physical activity. ? Eating a healthy diet. ? Avoiding tobacco and drug use. ? Limiting alcohol use. ? Practicing safe sex. ? Taking low-dose aspirin every day. ? Taking vitamin and mineral supplements as recommended by your health care provider. What happens during an annual well check? The services and screenings done by your health care provider during your annual well check will depend on your age, overall health, lifestyle risk factors, and family history of disease. Counseling Your health care provider may ask you questions about your:  Alcohol use.  Tobacco use.  Drug use.  Emotional well-being.  Home and relationship well-being.  Sexual activity.  Eating habits.  History of falls.  Memory and ability to understand (cognition).  Work and work Statistician.  Reproductive health.  Screening You may have the following tests or measurements:  Height, weight, and BMI.  Blood pressure.  Lipid and cholesterol levels. These may be checked every 5 years, or more frequently if you are over 28 years old.  Skin check.  Lung cancer screening. You may  have this screening every year starting at age 74 if you have a 30-pack-year history of smoking and currently smoke or have quit within the past 15 years.  Fecal occult blood test (FOBT) of the stool. You may have this test every year starting at age 3.  Flexible sigmoidoscopy or colonoscopy. You may have a sigmoidoscopy every 5 years or a colonoscopy every 10 years starting at age 21.  Hepatitis C blood test.  Hepatitis B blood test.  Sexually transmitted disease (STD) testing.  Diabetes screening. This is done by checking your blood sugar (glucose) after you have not eaten for a while (fasting). You may have this done every 1-3 years.  Bone density scan. This is done to screen for osteoporosis. You may have this done starting at age 45.  Mammogram. This may be done every 1-2 years. Talk to your health care provider about how often you should have regular mammograms.  Talk with your health care provider about your test results, treatment options, and if necessary, the need for more tests. Vaccines Your health care provider may recommend certain vaccines, such as:  Influenza vaccine. This is recommended every year.  Tetanus, diphtheria, and acellular pertussis (Tdap, Td) vaccine. You may need a Td booster every 10 years.  Varicella vaccine. You may need this if you have not been vaccinated.  Zoster vaccine. You may need this after age 29.  Measles, mumps, and rubella (MMR) vaccine. You may need at least one dose of MMR if you were born in 1957 or later. You may also need a second dose.  Pneumococcal 13-valent conjugate (PCV13)  vaccine. One dose is recommended after age 70.  Pneumococcal polysaccharide (PPSV23) vaccine. One dose is recommended after age 37.  Meningococcal vaccine. You may need this if you have certain conditions.  Hepatitis A vaccine. You may need this if you have certain conditions or if you travel or work in places where you may be exposed to hepatitis  A.  Hepatitis B vaccine. You may need this if you have certain conditions or if you travel or work in places where you may be exposed to hepatitis B.  Haemophilus influenzae type b (Hib) vaccine. You may need this if you have certain conditions.  Talk to your health care provider about which screenings and vaccines you need and how often you need them. This information is not intended to replace advice given to you by your health care provider. Make sure you discuss any questions you have with your health care provider. Document Released: 09/23/2015 Document Revised: 05/16/2016 Document Reviewed: 06/28/2015 Elsevier Interactive Patient Education  [employer].

## 2018-04-02 NOTE — Progress Notes (Signed)
Patient: Janet Craig, Female    DOB: [birth year], 72 y.o.   MRN: [id number] Visit Date: 04/02/2018  Today's Provider: Lavon Paganini, MD   Chief Complaint  Patient presents with  . Annual Exam   Subjective:     Complete Physical Janet Craig is a 72 y.o. female. She feels well. 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 She reports she is sleeping fairly well.  She has her AWV on 03/31/2018. Last mammogram- 03/19/2017- BI-RADS 1. Has next one scheduled for 04/15/18. Colonoscopy- 12/07/2014- diverticulitis. Last BMD- 10/27/2014- osteopenia. -----------------------------------------------------------   Review of Systems  Constitutional: Negative.   HENT: Negative.   Eyes: Negative.   Respiratory: Negative.   Cardiovascular: Negative.   Gastrointestinal: Negative.   Endocrine: Negative.   Genitourinary: Negative.   Musculoskeletal: Negative.   Skin: Negative.   Allergic/Immunologic: Negative.   Neurological: Negative.   Hematological: Negative.   Psychiatric/Behavioral: Negative.     Social History   Socioeconomic History  . Marital status: Married    Spouse name: Not on file  . Number of children: 1  . Years of education: Not on file  . Highest education level: Master's degree (e.g., MA, MS, MEng, MEd, MSW, MBA)  Occupational History  . 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 Financial resource strain: Not hard at all  . Food insecurity:    Worry: Never true    Inability: Never true  . Transportation needs:    Medical: No    Non-medical: No  Tobacco Use  . Smoking status: Never Smoker  . Smokeless tobacco: Never Used  Substance and Sexual Activity  . Alcohol use: No  . Drug use: No  . Sexual activity: Yes  Lifestyle  . Physical activity:    Days per week: Not on file    Minutes per session: Not on file  . Stress: Not at all  Relationships    . Social connections:    Talks on phone: Not on file    Gets together: Not on file    Attends religious service: Not on file    Active member of club or organization: Not on file    Attends meetings of clubs or organizations: Not on file    Relationship status: Not on file  . Intimate partner violence:    Fear of current or ex partner: Not on file    Emotionally abused: Not on file    Physically abused: Not on file    Forced sexual activity: Not on file  Other Topics Concern  . Not on file  Social History Narrative  . Not on file    History reviewed. No pertinent past medical history.   Patient Active Problem List   Diagnosis Date Noted  . Glaucoma 10/11/2015  . Blood glucose elevated 10/11/2015  . HLD (hyperlipidemia) 10/11/2015  . Adult hypothyroidism 10/11/2015  . Cannot sleep 10/11/2015  . Degenerative arthritis of hip 10/11/2015    Past Surgical History:  Procedure Laterality Date  . ABDOMINAL HYSTERECTOMY    . APPENDECTOMY    . COLONOSCOPY  12/2005  . EYE SURGERY    . LUMBAR MICRODISCECTOMY  11/2010  . TOTAL HIP ARTHROPLASTY Left 02/24/13    Her family history includes Atrial fibrillation in her sister; Breast cancer (age of onset: 69) in her mother; CVA in her father; Colon cancer in her paternal  aunt; Diabetes in her sister; Lung cancer in her father, maternal grandfather, and mother; Lupus in her brother; Melanoma in her father; Multiple sclerosis in her sister.      Current Outpatient Medications:  .  levothyroxine (SYNTHROID, LEVOTHROID) 50 MCG tablet, Take 1 tablet (50 mcg total) by mouth daily., Disp: 30 tablet, Rfl: 11 .  Multiple Vitamin tablet, Take 1 tablet by mouth daily. , Disp: , Rfl:  .  Omega-3 Fatty Acids (FISH OIL MAXIMUM STRENGTH) 1200 MG CPDR, Take 1,200 mg by mouth daily. , Disp: , Rfl:   Patient Care Team: Birdie Sons, MD as PCP - General (Family Medicine) Carmon Ginsberg, [place] as Referring Physician (Family Medicine) Birder Robson,  MD as Referring Physician (Ophthalmology) Brita Romp Dionne Bucy, MD as Consulting Physician (Family Medicine)     Objective:   Vitals: BP 102/66 (BP Location: Left Arm, Patient Position: Sitting, Cuff Size: Normal)   Pulse 76   Temp 98.2 F (36.8 C) (Oral)   Resp 16   Ht 5\' 6"  (1.676 m)   Wt 132 lb (59.9 kg)   SpO2 98%   BMI 21.31 kg/m   Physical Exam  Constitutional: She is oriented to person, place, and time. She appears well-developed and well-nourished. No distress.  HENT:  Head: Normocephalic and atraumatic.  Right Ear: External ear normal.  Left Ear: External ear normal.  Nose: Nose normal.  Mouth/Throat: Oropharynx is clear and moist.  Eyes: Pupils are equal, round, and reactive to light. Conjunctivae and EOM are normal. No scleral icterus.  Neck: Neck supple. No thyromegaly present.  Cardiovascular: Normal rate, regular rhythm, normal heart sounds and intact distal pulses.  No murmur heard. Pulmonary/Chest: Effort normal and breath sounds normal. No respiratory distress. She has no wheezes. She has no rales.  Abdominal: Soft. Bowel sounds are normal. She exhibits no distension. There is no tenderness. There is no rebound and no guarding.  Genitourinary:  Genitourinary Comments: Breasts: breasts appear normal, no suspicious masses, no skin or nipple changes or axillary nodes.   Musculoskeletal: She exhibits no edema or deformity.  Lymphadenopathy:    She has no cervical adenopathy.  Neurological: She is alert and oriented to person, place, and time.  Skin: Skin is warm and dry. Capillary refill takes less than 2 seconds. No rash noted.  Psychiatric: She has a normal mood and affect. Her behavior is normal.  Vitals reviewed.   Activities of Daily Living In your present state of health, do you have any difficulty performing the following activities: 03/31/2018  Hearing? N  Vision? N  Difficulty concentrating or making decisions? N  Walking or climbing stairs? N    Dressing or bathing? N  Doing errands, shopping? N  Preparing Food and eating ? N  Using the Toilet? N  In the past six months, have you accidently leaked urine? N  Do you have problems with loss of bowel control? N  Managing your Medications? N  Managing your Finances? N  Housekeeping or managing your Housekeeping? N  Some recent data might be hidden    Fall Risk Assessment Fall Risk  03/31/2018 03/29/2017 10/25/2015  Falls in the past year? No No No     Depression Screen PHQ 2/9 Scores 03/31/2018 03/29/2017 03/29/2017 10/25/2015  PHQ - 2 Score 0 0 0 0  PHQ- 9 Score - 0 - -    Pt declined 6CIT at AWV. 6CIT Screen 03/29/2017  What Year? 0 points  What month? 0 points  What time? 0  points  Count back from 20 0 points  Months in reverse 0 points  Repeat phrase 0 points  Total Score 0    Assessment & Plan:    Annual Physical Reviewed patient's Family Medical History Reviewed and updated list of patient's medical providers Assessment of cognitive impairment was done Assessed patient's functional ability Established a written schedule for health screening [place] Completed and Reviewed  Exercise Activities and Dietary recommendations Goals    None      Immunization History  Administered Date(s) Administered  . Influenza, High Dose Seasonal PF 07/09/2017  . Pneumococcal Conjugate-13 10/20/2014  . Pneumococcal Polysaccharide-23 10/25/2015    Health Maintenance  Topic Date Due  . TETANUS/TDAP  03/11/2027 (Originally 02/17/1966)  . INFLUENZA VACCINE  04/10/2018  . MAMMOGRAM  03/20/2019  . COLONOSCOPY  12/06/2024  . DEXA SCAN  Completed  . Hepatitis C Screening  Completed  . PNA vac Low Risk Adult  Completed     Discussed health benefits of physical activity, and encouraged her to engage in regular exercise appropriate for her age and condition.     ------------------------------------------------------------------------------------------------------------  Problem List Items Addressed This Visit      Endocrine   Adult hypothyroidism   Relevant Orders   TSH     Musculoskeletal and Integument   Osteopenia   Relevant Orders   DG Bone Density     Other   Blood glucose elevated   Relevant Orders   Hemoglobin A1c   HLD (hyperlipidemia)   Relevant Orders   Lipid panel   Comprehensive metabolic panel    Other Visit Diagnoses    Encounter for annual physical exam    -  Primary   Relevant Orders   DG Bone Density   TSH   Hemoglobin A1c   Lipid panel   CBC   Comprehensive metabolic panel   Postmenopausal       Relevant Orders   DG Bone Density      Return in about 6 months (around 10/03/2018) for chronic disease f/u.   The entirety of the information documented in the History of Present Illness, Review of Systems and Physical Exam were personally obtained by me. Portions of this information were initially documented by Raquel Sarna Ratchford, CMA and reviewed by me for thoroughness and accuracy.    Virginia Crews, MD, MPH [facility] 04/02/2018 3:33 PM

## 2018-04-28 ENCOUNTER — Ambulatory Visit
Admission: RE | Admit: 2018-04-28 | Discharge: 2018-04-28 | Disposition: A | Discharge status: Home / Self Care | Payer: Medicare Other | Source: Ambulatory Visit | Attending: Family Medicine | Admitting: Family Medicine

## 2018-04-28 DIAGNOSIS — M8580 Other specified disorders of bone density and structure, unspecified site: Secondary | ICD-10-CM | POA: Diagnosis present

## 2018-04-28 DIAGNOSIS — Z0000 Encounter for general adult medical examination without abnormal findings: Secondary | ICD-10-CM | POA: Diagnosis present

## 2018-04-28 DIAGNOSIS — Z1231 Encounter for screening mammogram for malignant neoplasm of breast: Secondary | ICD-10-CM | POA: Insufficient documentation

## 2018-04-28 DIAGNOSIS — Z780 Asymptomatic menopausal state: Secondary | ICD-10-CM | POA: Diagnosis present

## 2018-04-30 ENCOUNTER — Telehealth: Payer: Self-pay

## 2018-04-30 NOTE — Telephone Encounter (Signed)
-----   Message from Virginia Crews, MD sent at 04/30/2018 10:44 AM EDT ----- Bone density scan shows osteopenia (this is some bone loss, but not as bad as osteoporosis).  Recommend regular weight bearing exercise, avoiding smoking, and adequate Ca (1200mg /day) and Vit D (1000 units daily) via diet or supplement.  We will recheck in 2 years to ensure this hasn't worsened.  Virginia Crews, MD, MPH [facility] 04/30/2018 10:44 AM

## 2018-04-30 NOTE — Telephone Encounter (Signed)
Pt advised.

## 2018-07-23 ENCOUNTER — Ambulatory Visit: Payer: Medicare Other | Admitting: Family Medicine

## 2018-07-23 ENCOUNTER — Encounter: Payer: Self-pay | Admitting: Family Medicine

## 2018-07-23 VITALS — BP 131/82 | HR 64 | Temp 97.7°F | Wt 133.6 lb

## 2018-07-23 DIAGNOSIS — L723 Sebaceous cyst: Secondary | ICD-10-CM | POA: Diagnosis not present

## 2018-07-23 NOTE — Progress Notes (Signed)
Patient: Janet Craig Female    DOB: 1946-05-08   72 y.o.   MRN: [id number] Visit Date: 07/23/2018  Today's Provider: Lavon Paganini, MD   Chief Complaint  Patient presents with  . Lump on neck   Subjective:    HPI Patient presents today C/O a lump on her neck for the past several years. She reports the area is raised and gradually getting larger. She denies pain. It did drain a malodorous substance once a few years ago.  No fevers.    Allergies  Allergen Reactions  . Neomycin-Bacitracin Zn-Polymyx   . Penicillins   . Miconazole Nitrate Rash     Current Outpatient Medications:  .  levothyroxine (SYNTHROID, LEVOTHROID) 50 MCG tablet, Take 1 tablet (50 mcg total) by mouth daily., Disp: 30 tablet, Rfl: 11 .  Multiple Vitamin tablet, Take 1 tablet by mouth daily. , Disp: , Rfl:  .  Omega-3 Fatty Acids (FISH OIL MAXIMUM STRENGTH) 1200 MG CPDR, Take 1,200 mg by mouth daily. , Disp: , Rfl:   Review of Systems  Constitutional: Negative.   Respiratory: Negative.   Cardiovascular: Negative.   Musculoskeletal: Negative.   Skin:       Lump on neck   Neurological: Negative.     Social History   Tobacco Use  . Smoking status: Never Smoker  . Smokeless tobacco: Never Used  Substance Use Topics  . Alcohol use: No   Objective:   BP 131/82 (BP Location: Right Arm, Patient Position: Sitting, Cuff Size: Normal)   Pulse 64   Temp 97.7 F (36.5 C) (Oral)   Wt 133 lb 9.6 oz (60.6 kg)   SpO2 97%   BMI 21.56 kg/m  Vitals:   07/23/18 1537  BP: 131/82  Pulse: 64  Temp: 97.7 F (36.5 C)  TempSrc: Oral  SpO2: 97%  Weight: 133 lb 9.6 oz (60.6 kg)     Physical Exam  Constitutional: She is oriented to person, place, and time. She appears well-developed and well-nourished. No distress.  HENT:  Head: Normocephalic and atraumatic.  Right Ear: External ear normal.  Left Ear: External ear normal.  Mouth/Throat: Oropharynx is clear and moist.  Eyes: Pupils are  equal, round, and reactive to light. Conjunctivae are normal. No scleral icterus.  Neck: Neck supple. No thyromegaly present.  Small <1cm raised, mobile, nontender lump on posterior R neck with no surrounding erythema and central punctate  Cardiovascular: Normal rate and regular rhythm.  Pulmonary/Chest: Effort normal. No respiratory distress.  Musculoskeletal: She exhibits no edema.  Lymphadenopathy:    She has no cervical adenopathy.  Neurological: She is alert and oriented to person, place, and time.  Skin: Skin is warm and dry. Capillary refill takes less than 2 seconds. No rash noted.  Psychiatric: She has a normal mood and affect. Her behavior is normal.  Vitals reviewed.       Assessment & Plan:   1. Sebaceous cyst - Present and stable for several years - Discussed the benign etiology of this -No signs of infection - We will refer to dermatology to discuss possible excision - Ambulatory referral to Dermatology    Return if symptoms worsen or fail to improve.   The entirety of the information documented in the History of Present Illness, Review of Systems and Physical Exam were personally obtained by me. Portions of this information were initially documented by Tiburcio Pea, CMA and reviewed by me for thoroughness and accuracy.    Lavon Paganini  Jerilynn Mages, MD, MPH [facility] 07/23/2018 3:59 PM

## 2018-07-23 NOTE — Patient Instructions (Signed)
Epidermal Cyst An epidermal cyst is sometimes called an epidermal inclusion cyst or an infundibular cyst. It is a sac made of skin tissue. The sac contains a substance called keratin. Keratin is a protein that is normally secreted through the hair follicles. When keratin becomes trapped in the top layer of skin (epidermis), it can form an epidermal cyst. Epidermal cysts are usually found on the face, neck, trunk, and genitals. These cysts are usually harmless (benign), and they may not cause symptoms unless they become infected. It is important not to pop epidermal cysts yourself. What are the causes? This condition may be caused by:  A blocked hair follicle.  A hair that curls and re-enters the skin instead of growing straight out of the skin (ingrown hair).  A blocked pore.  Irritated skin.  An injury to the skin.  Certain conditions that are passed along from parent to child (inherited).  Human papillomavirus (HPV).  What increases the risk? The following factors may make you more likely to develop an epidermal cyst:  Having acne.  Being overweight.  Wearing tight clothing.  What are the signs or symptoms? The only symptom of this condition may be a small, painless lump underneath the skin. When an epidermal cyst becomes infected, symptoms may include:  Redness.  Inflammation.  Tenderness.  Warmth.  Fever.  Keratin draining from the cyst. Keratin may look like a grayish-white, bad-smelling substance.  Pus draining from the cyst.  How is this diagnosed? This condition is diagnosed with a physical exam. In some cases, you may have a sample of tissue (biopsy) taken from your cyst to be examined under a microscope or tested for bacteria. You may be referred to a health care provider who specializes in skin care (dermatologist). How is this treated? In many cases, epidermal cysts go away on their own without treatment. If a cyst becomes infected, treatment may  include:  Opening and draining the cyst. After draining, minor surgery to remove the rest of the cyst may be done.  Antibiotic medicine to help prevent infection.  Injections of medicines (steroids) that help to reduce inflammation.  Surgery to remove the cyst. Surgery may be done if: ? The cyst becomes large. ? The cyst bothers you. ? There is a chance that the cyst could turn into cancer.  Follow these instructions at home:  Take over-the-counter and prescription medicines only as told by your health care provider.  If you were prescribed an antibiotic, use it as told by your health care provider. Do not stop using the antibiotic even if you start to feel better.  Keep the area around your cyst clean and dry.  Wear loose, dry clothing.  Do not try to pop your cyst.  Avoid touching your cyst.  Check your cyst every day for signs of infection.  Keep all follow-up visits as told by your health care provider. This is important. How is this prevented?  Wear clean, dry, clothing.  Avoid wearing tight clothing.  Keep your skin clean and dry. Shower or take baths every day.  Wash your body with a benzoyl peroxide wash when you shower or bathe. Contact a health care provider if:  Your cyst develops symptoms of infection.  Your condition is not improving or is getting worse.  You develop a cyst that looks different from other cysts you have had.  You have a fever. Get help right away if:  Redness spreads from the cyst into the surrounding area. This information is   not intended to replace advice given to you by your health care provider. Make sure you discuss any questions you have with your health care provider. Document Released: 07/28/2004 Document Revised: 04/25/2016 Document Reviewed: 06/29/2015 Elsevier Interactive Patient Education  2018 [employer]  

## 2018-07-26 LAB — TSH: TSH: 7.51 u[IU]/mL — ABNORMAL HIGH (ref 0.450–4.500)

## 2018-07-26 LAB — COMPREHENSIVE METABOLIC PANEL
ALBUMIN: 4.4 g/dL (ref 3.5–4.8)
ALK PHOS: 61 IU/L (ref 39–117)
ALT: 12 IU/L (ref 0–32)
AST: 19 IU/L (ref 0–40)
Albumin/Globulin Ratio: 1.7 (ref 1.2–2.2)
BUN / CREAT RATIO: 18 (ref 12–28)
BUN: 12 mg/dL (ref 8–27)
Bilirubin Total: 0.7 mg/dL (ref 0.0–1.2)
CO2: 24 mmol/L (ref 20–29)
Calcium: 9.5 mg/dL (ref 8.7–10.3)
Chloride: 100 mmol/L (ref 96–106)
Creatinine, Ser: 0.65 mg/dL (ref 0.57–1.00)
GFR calc Af Amer: 103 mL/min/{1.73_m2} (ref >59)
GFR calc non Af Amer: 90 mL/min/{1.73_m2} (ref >59)
GLOBULIN, TOTAL: 2.6 g/dL (ref 1.5–4.5)
Glucose: 99 mg/dL (ref 65–99)
POTASSIUM: 4.2 mmol/L (ref 3.5–5.2)
SODIUM: 139 mmol/L (ref 134–144)
Total Protein: 7 g/dL (ref 6.0–8.5)

## 2018-07-26 LAB — LIPID PANEL
CHOL/HDL RATIO: 4.1 ratio (ref 0.0–4.4)
CHOLESTEROL TOTAL: 228 mg/dL — AB (ref 100–199)
HDL: 55 mg/dL (ref >39)
LDL Calculated: 157 mg/dL — ABNORMAL HIGH (ref 0–99)
TRIGLYCERIDES: 80 mg/dL (ref 0–149)
VLDL Cholesterol Cal: 16 mg/dL (ref 5–40)

## 2018-07-26 LAB — CBC
HEMATOCRIT: 37.5 % (ref 34.0–46.6)
HEMOGLOBIN: 12.4 g/dL (ref 11.1–15.9)
MCH: 28.9 pg (ref 26.6–33.0)
MCHC: 33.1 g/dL (ref 31.5–35.7)
MCV: 87 fL (ref 79–97)
Platelets: 336 10*3/uL (ref 150–450)
RBC: 4.29 x10E6/uL (ref 3.77–5.28)
RDW: 12.8 % (ref 12.3–15.4)
WBC: 4.9 10*3/uL (ref 3.4–10.8)

## 2018-07-26 LAB — HEMOGLOBIN A1C
ESTIMATED AVERAGE GLUCOSE: 126 mg/dL
Hgb A1c MFr Bld: 6 % — ABNORMAL HIGH (ref 4.8–5.6)

## 2018-07-28 ENCOUNTER — Telehealth: Payer: Self-pay

## 2018-07-28 MED ORDER — LEVOTHYROXINE SODIUM 75 MCG PO TABS: 75.0000 ug | ORAL_TABLET | Freq: Every day | ORAL | 3 refills | Status: DC

## 2018-07-28 NOTE — Telephone Encounter (Signed)
-----   Message from Virginia Crews, MD sent at 07/28/2018 12:25 PM EST ----- TSH is high.  This means patient is not getting enough Synthroid.  Wonder if she is missed any doses lately.  If not, we will increase to 75 mcg daily.  Hemoglobin A1c, 61-month average of blood sugars, remains in the prediabetic range.  It has increased some in the last year.  Recommend low-carb diet and regular exercise.  Cholesterol is high.  10 year risk of heart disease/stroke is high at 9.7 %.  This suggests that a statin would be important to lower cholesterol and lower heart disease and stroke risk.  Normal blood counts, kidney function, liver function, electrolytes.  Virginia Crews, MD, MPH [facility] 07/28/2018 12:25 PM

## 2018-07-28 NOTE — Telephone Encounter (Signed)
Patient advised. She states she has not missed any Synthroid dose. RX sent to [facility]. Patient prefers to try lifestyle changes before starting a statin medication.

## 2018-10-03 ENCOUNTER — Ambulatory Visit: Payer: Medicare Other | Admitting: Family Medicine

## 2018-10-03 ENCOUNTER — Encounter: Payer: Self-pay | Admitting: Family Medicine

## 2018-10-03 VITALS — BP 113/74 | HR 69 | Temp 97.4°F | Wt 134.0 lb

## 2018-10-03 DIAGNOSIS — E039 Hypothyroidism, unspecified: Secondary | ICD-10-CM

## 2018-10-03 DIAGNOSIS — R7303 Prediabetes: Secondary | ICD-10-CM

## 2018-10-03 DIAGNOSIS — M533 Sacrococcygeal disorders, not elsewhere classified: Secondary | ICD-10-CM

## 2018-10-03 DIAGNOSIS — Z23 Encounter for immunization: Secondary | ICD-10-CM | POA: Diagnosis not present

## 2018-10-03 NOTE — Progress Notes (Signed)
Patient: Janet Craig Female    DOB: 05-31-1946   73 y.o.   MRN: [id number] Visit Date: 10/03/2018  Today's Provider: Lavon Paganini, MD   Chief Complaint  Patient presents with  . Hypothyroidism  . Pre-diabetes   Subjective:    I, Janet Craig, CMA, am acting as a Education administrator for Janet Paganini, MD.   HPI  Prediabetes, Follow-up:   Lab Results  Component Value Date   HGBA1C 6.0 (H) 07/25/2018   HGBA1C 5.8 (H) 04/19/2017   HGBA1C 6.1 (H) 10/28/2015   GLUCOSE 99 07/25/2018   GLUCOSE 103 (H) 10/28/2015   GLUCOSE 147 (H) 02/25/2013    Last seen for for this6 months ago.  Management since that visit includes no changes. Current symptoms include none   Weight trend: stable Prior visit with dietician: no Current diet: in general, a "healthy" diet   Current exercise: walking  Pertinent Labs:    Component Value Date/Time   CHOL 228 (H) 07/25/2018 0937   TRIG 80 07/25/2018 0937   CHOLHDL 4.1 07/25/2018 0937   CREATININE 0.65 07/25/2018 0937   CREATININE 0.49 (L) 02/25/2013 0302    Wt Readings from Last 3 Encounters:  10/03/18 134 lb (60.8 kg)  07/23/18 133 lb 9.6 oz (60.6 kg)  04/02/18 132 lb (59.9 kg)    Hypothyroid, follow-up:  TSH  Date Value Ref Range Status  07/25/2018 7.510 (H) 0.450 - 4.500 uIU/mL Final  04/19/2017 2.000 0.450 - 4.500 uIU/mL Final  12/23/2015 0.588 0.450 - 4.500 uIU/mL Final   Wt Readings from Last 3 Encounters:  10/03/18 134 lb (60.8 kg)  07/23/18 133 lb 9.6 oz (60.6 kg)  04/02/18 132 lb (59.9 kg)    She was last seen for hypothyroid 6 months ago.  Management since that visit includes increased Levothyroxine to 75 mcg. She reports good compliance with treatment. She is not having side effects.  She is exercising. She is experiencing none She denies change in energy level, diarrhea, heat / cold intolerance, nervousness, palpitations and weight changes Weight trend:  stable  ------------------------------------------------------------------------ Pain in Coccyx for ~2-3 weeks.  No known injury or trauma.  Only notices it when laying back from sitting.  Does not feel it needs any intervention   Allergies  Allergen Reactions  . Neomycin-Bacitracin Zn-Polymyx   . Penicillins   . Miconazole Nitrate Rash     Current Outpatient Medications:  .  levothyroxine (SYNTHROID, LEVOTHROID) 75 MCG tablet, Take 1 tablet (75 mcg total) by mouth daily., Disp: 90 tablet, Rfl: 3 .  Multiple Vitamin tablet, Take 1 tablet by mouth daily. , Disp: , Rfl:  .  Omega-3 Fatty Acids (FISH OIL MAXIMUM STRENGTH) 1200 MG CPDR, Take 1,200 mg by mouth daily. , Disp: , Rfl:   Review of Systems  Constitutional: Negative.   Respiratory: Negative.   Cardiovascular: Negative.   Endocrine: Negative.   Musculoskeletal: Negative.     Social History   Tobacco Use  . Smoking status: Never Smoker  . Smokeless tobacco: Never Used  Substance Use Topics  . Alcohol use: No      Objective:   BP 113/74 (BP Location: Right Arm, Patient Position: Sitting, Cuff Size: Normal)   Pulse 69   Temp (!) 97.4 F (36.3 C) (Oral)   Wt 134 lb (60.8 kg)   BMI 21.63 kg/m  Vitals:   10/03/18 0812  BP: 113/74  Pulse: 69  Temp: (!) 97.4 F (36.3 C)  TempSrc: Oral  Weight: 134  lb (60.8 kg)     Physical Exam Vitals signs reviewed.  Constitutional:      General: She is not in acute distress.    Appearance: Normal appearance. She is not diaphoretic.  HENT:     Head: Normocephalic and atraumatic.     Mouth/Throat:     Pharynx: Oropharynx is clear.  Eyes:     General: No scleral icterus.    Conjunctiva/sclera: Conjunctivae normal.  Neck:     Musculoskeletal: Neck supple.  Cardiovascular:     Rate and Rhythm: Normal rate and regular rhythm.     Pulses: Normal pulses.     Heart sounds: Normal heart sounds. No murmur.  Pulmonary:     Effort: Pulmonary effort is normal. No  respiratory distress.     Breath sounds: Normal breath sounds. No wheezing or rhonchi.  Abdominal:     General: There is no distension.     Palpations: Abdomen is soft.     Tenderness: There is no abdominal tenderness.  Musculoskeletal:     Right lower leg: No edema.     Left lower leg: No edema.  Lymphadenopathy:     Cervical: No cervical adenopathy.  Skin:    General: Skin is warm and dry.     Capillary Refill: Capillary refill takes less than 2 seconds.     Findings: No rash.  Neurological:     Mental Status: She is alert and oriented to person, place, and time. Mental status is at baseline.  Psychiatric:        Mood and Affect: Mood normal.        Behavior: Behavior normal.        Assessment & Plan   Problem List Items Addressed This Visit      Endocrine   Adult hypothyroidism - Primary    Last TSH unctonrolled and Levothyroxine increased Asymptomatic Continue Synthroid at current dose pending TSH results Recheck TSH      Relevant Orders   TSH     Other   Prediabetes    Discussed diet control with low carb diet Recheck A1c      Relevant Orders   Hemoglobin A1c    Other Visit Diagnoses    Coccyx pain        - suspect it is bruised - no red flags - discussed conservative treatment   Return in about 6 months (around 04/03/2019) for AWV/CPE (after 7/24).   The entirety of the information documented in the History of Present Illness, Review of Systems and Physical Exam were personally obtained by me. Portions of this information were initially documented by Janet Craig, CMA and reviewed by me for thoroughness and accuracy.    Janet Crews, MD, MPH [facility] 10/03/2018 8:59 AM

## 2018-10-03 NOTE — Patient Instructions (Signed)
Prediabetes Eating Plan  Prediabetes is a condition that causes blood sugar (glucose) levels to be higher than normal. This increases the risk for developing diabetes. In order to prevent diabetes from developing, your health care provider may recommend a diet and other lifestyle changes to help you:  · Control your blood glucose levels.  · Improve your cholesterol levels.  · Manage your blood pressure.  Your health care provider may recommend working with a diet and nutrition specialist (dietitian) to make a meal plan that is best for you.  What are tips for following this plan?  Lifestyle  · Set weight loss goals with the help of your health care team. It is recommended that most people with prediabetes lose 7% of their current body weight.  · Exercise for at least 30 minutes at least 5 days a week.  · Attend a support group or seek ongoing support from a mental health counselor.  · Take over-the-counter and prescription medicines only as told by your health care provider.  Reading food labels  · Read food labels to check the amount of fat, salt (sodium), and sugar in prepackaged foods. Avoid foods that have:  ? Saturated fats.  ? Trans fats.  ? Added sugars.  · Avoid foods that have more than 300 milligrams (mg) of sodium per serving. Limit your daily sodium intake to less than 2,300 mg each day.  Shopping  · Avoid buying pre-made and processed foods.  Cooking  · Cook with olive oil. Do not use butter, lard, or ghee.  · Bake, broil, grill, or boil foods. Avoid frying.  Meal planning    · Work with your dietitian to develop an eating plan that is right for you. This may include:  ? Tracking how many calories you take in. Use a food diary, notebook, or mobile application to track what you eat at each meal.  ? Using the glycemic index (GI) to plan your meals. The index tells you how quickly a food will raise your blood glucose. Choose low-GI foods. These foods take a longer time to raise blood glucose.  · Consider  following a Mediterranean diet. This diet includes:  ? Several servings each day of fresh fruits and vegetables.  ? Eating fish at least twice a week.  ? Several servings each day of whole grains, beans, nuts, and seeds.  ? Using olive oil instead of other fats.  ? Moderate alcohol consumption.  ? Eating small amounts of red meat and whole-fat dairy.  · If you have high blood pressure, you may need to limit your sodium intake or follow a diet such as the DASH eating plan. DASH is an eating plan that aims to lower high blood pressure.  What foods are recommended?  The items listed below may not be a complete list. Talk with your dietitian about what dietary choices are best for you.  Grains  Whole grains, such as whole-wheat or whole-grain breads, crackers, cereals, and pasta. Unsweetened oatmeal. Bulgur. Barley. Quinoa. Brown rice. Corn or whole-wheat flour tortillas or taco shells.  Vegetables  Lettuce. Spinach. Peas. Beets. Cauliflower. Cabbage. Broccoli. Carrots. Tomatoes. Squash. Eggplant. Herbs. Peppers. Onions. Cucumbers. Brussels sprouts.  Fruits  Berries. Bananas. Apples. Oranges. Grapes. Papaya. Mango. Pomegranate. Kiwi. Grapefruit. Cherries.  Meats and other protein foods  Seafood. Poultry without skin. Lean cuts of pork and beef. Tofu. Eggs. Nuts. Beans.  Dairy  Low-fat or fat-free dairy products, such as yogurt, cottage cheese, and cheese.  Beverages  Water.   Tea. Coffee. Sugar-free or diet soda. Seltzer water. Lowfat or no-fat milk. Milk alternatives, such as soy or almond milk.  Fats and oils  Olive oil. Canola oil. Sunflower oil. Grapeseed oil. Avocado. Walnuts.  Sweets and desserts  Sugar-free or low-fat pudding. Sugar-free or low-fat ice cream and other frozen treats.  Seasoning and other foods  Herbs. Sodium-free spices. Mustard. Relish. Low-fat, low-sugar ketchup. Low-fat, low-sugar barbecue sauce. Low-fat or fat-free mayonnaise.  What foods are not recommended?  The items listed below may not be a  complete list. Talk with your dietitian about what dietary choices are best for you.  Grains  Refined white flour and flour products, such as bread, pasta, snack foods, and cereals.  Vegetables  Canned vegetables. Frozen vegetables with butter or cream sauce.  Fruits  Fruits canned with syrup.  Meats and other protein foods  Fatty cuts of meat. Poultry with skin. Breaded or fried meat. Processed meats.  Dairy  Full-fat yogurt, cheese, or milk.  Beverages  Sweetened drinks, such as sweet iced tea and soda.  Fats and oils  Butter. Lard. Ghee.  Sweets and desserts  Baked goods, such as cake, cupcakes, pastries, cookies, and cheesecake.  Seasoning and other foods  Spice mixes with added salt. Ketchup. Barbecue sauce. Mayonnaise.  Summary  · To prevent diabetes from developing, you may need to make diet and other lifestyle changes to help control blood sugar, improve cholesterol levels, and manage your blood pressure.  · Set weight loss goals with the help of your health care team. It is recommended that most people with prediabetes lose 7 percent of their current body weight.  · Consider following a Mediterranean diet that includes plenty of fresh fruits and vegetables, whole grains, beans, nuts, seeds, fish, lean meat, low-fat dairy, and healthy oils.  This information is not intended to replace advice given to you by your health care provider. Make sure you discuss any questions you have with your health care provider.  Document Released: 01/11/2015 Document Revised: 10/31/2016 Document Reviewed: 10/31/2016  Elsevier Interactive Patient Education © 2019 [employer]

## 2018-10-03 NOTE — Assessment & Plan Note (Signed)
Discussed diet control with low carb diet Recheck A1c

## 2018-10-03 NOTE — Assessment & Plan Note (Signed)
Last TSH unctonrolled and Levothyroxine increased Asymptomatic Continue Synthroid at current dose pending TSH results Recheck TSH

## 2018-10-04 LAB — HEMOGLOBIN A1C
Est. average glucose Bld gHb Est-mCnc: 128 mg/dL
HEMOGLOBIN A1C: 6.1 % — AB (ref 4.8–5.6)

## 2018-10-04 LAB — TSH: TSH: 0.685 u[IU]/mL (ref 0.450–4.500)

## 2018-10-06 ENCOUNTER — Telehealth: Payer: Self-pay

## 2018-10-06 NOTE — Telephone Encounter (Signed)
-----   Message from Virginia Crews, MD sent at 10/06/2018  9:00 AM EST ----- Normal TSH.  Continue current synthroid dose.  A1c remains in prediabetic range, but is increasing.  Watch carb intake

## 2018-10-06 NOTE — Telephone Encounter (Signed)
Patient was advised.  

## 2018-11-12 ENCOUNTER — Other Ambulatory Visit: Payer: Self-pay | Admitting: Family Medicine

## 2018-11-12 DIAGNOSIS — E039 Hypothyroidism, unspecified: Secondary | ICD-10-CM

## 2019-04-13 ENCOUNTER — Encounter: Payer: Medicare Other | Admitting: Family Medicine

## 2019-04-13 ENCOUNTER — Ambulatory Visit: Payer: Medicare Other

## 2019-05-26 ENCOUNTER — Telehealth: Payer: Self-pay | Admitting: Family Medicine

## 2019-05-26 DIAGNOSIS — E039 Hypothyroidism, unspecified: Secondary | ICD-10-CM

## 2019-05-26 MED ORDER — LEVOTHYROXINE SODIUM 50 MCG PO TABS: 50.0000 ug | ORAL_TABLET | Freq: Every day | ORAL | 5 refills | Status: DC

## 2019-05-26 NOTE — Telephone Encounter (Signed)
Pt called needing a refill on her Levothyroxine [address]

## 2019-05-26 NOTE — Telephone Encounter (Signed)
Patient requested for Levothyroxine 50 MG to be send into pharmacy. Patient states that the pharmacy would not refill her medication even though she had refills left on the medication. Medication was send in to [facility].

## 2019-07-13 ENCOUNTER — Other Ambulatory Visit: Payer: Self-pay | Admitting: Family Medicine

## 2019-07-13 ENCOUNTER — Ambulatory Visit
Admission: RE | Admit: 2019-07-13 | Discharge: 2019-07-13 | Disposition: A | Discharge status: Home / Self Care | Payer: Medicare Other | Source: Ambulatory Visit | Attending: Family Medicine | Admitting: Family Medicine

## 2019-07-13 DIAGNOSIS — Z1231 Encounter for screening mammogram for malignant neoplasm of breast: Secondary | ICD-10-CM

## 2019-07-14 ENCOUNTER — Telehealth: Payer: Self-pay

## 2019-07-14 ENCOUNTER — Other Ambulatory Visit: Payer: Self-pay

## 2019-07-14 ENCOUNTER — Ambulatory Visit (INDEPENDENT_AMBULATORY_CARE_PROVIDER_SITE_OTHER): Payer: Medicare Other | Admitting: Family Medicine

## 2019-07-14 ENCOUNTER — Ambulatory Visit
Admission: RE | Admit: 2019-07-14 | Discharge: 2019-07-14 | Disposition: A | Discharge status: Home / Self Care | Payer: Medicare Other | Source: Ambulatory Visit | Attending: Family Medicine | Admitting: Family Medicine

## 2019-07-14 VITALS — BP 106/69 | HR 66 | Temp 97.3°F | Wt 126.0 lb

## 2019-07-14 DIAGNOSIS — Z23 Encounter for immunization: Secondary | ICD-10-CM

## 2019-07-14 DIAGNOSIS — M7072 Other bursitis of hip, left hip: Secondary | ICD-10-CM

## 2019-07-14 DIAGNOSIS — M533 Sacrococcygeal disorders, not elsewhere classified: Secondary | ICD-10-CM

## 2019-07-14 MED ORDER — PREDNISONE 20 MG PO TABS: ORAL_TABLET | ORAL | 0 refills | Status: DC

## 2019-07-14 NOTE — Progress Notes (Signed)
Patient: Janet Craig Female    DOB: 02-23-1946   73 y.o.   MRN: [id number] Visit Date: 07/14/2019  Today's Provider: Lavon Paganini, MD   Chief Complaint  Patient presents with  . Back Pain    Tailbone; after sitting for some time.    Subjective:     Back Pain This is a chronic problem. The current episode started more than 1 year ago. The pain is present in the sacro-iliac. The symptoms are aggravated by sitting. Stiffness is present all day. Pertinent negatives include no abdominal pain, bladder incontinence, bowel incontinence, chest pain, dysuria, fever, headaches, leg pain, numbness, paresis, paresthesias, pelvic pain, perianal numbness, tingling, weakness or weight loss. She has tried NSAIDs for the symptoms. The treatment provided no relief.   Most difficult when driving >1 hour.  Better with pressure offloading but difficult when driving  Has been ongoing for over a year.  She does not remember any change in activity, trauma, injury.  She denies urinary/bowel incontinence/retention, loss of sensation.  Allergies  Allergen Reactions  . Neomycin-Bacitracin Zn-Polymyx   . Penicillins   . Miconazole Nitrate Rash     Current Outpatient Medications:  .  levothyroxine (SYNTHROID) 50 MCG tablet, Take 1 tablet (50 mcg total) by mouth daily., Disp: 30 tablet, Rfl: 5 .  levothyroxine (SYNTHROID, LEVOTHROID) 75 MCG tablet, Take 1 tablet (75 mcg total) by mouth daily., Disp: 90 tablet, Rfl: 3 .  Multiple Vitamin tablet, Take 1 tablet by mouth daily. , Disp: , Rfl:  .  Omega-3 Fatty Acids (FISH OIL MAXIMUM STRENGTH) 1200 MG CPDR, Take 1,200 mg by mouth daily. , Disp: , Rfl:   Review of Systems  Constitutional: Negative.  Negative for fever and weight loss.  Respiratory: Negative.   Cardiovascular: Negative.  Negative for chest pain.  Gastrointestinal: Negative.  Negative for abdominal pain and bowel incontinence.  Genitourinary: Negative for bladder incontinence, dysuria  and pelvic pain.  Musculoskeletal: Positive for back pain. Negative for arthralgias, gait problem, joint swelling, myalgias, neck pain and neck stiffness.  Neurological: Negative for tingling, weakness, numbness, headaches and paresthesias.    Social History   Tobacco Use  . Smoking status: Never Smoker  . Smokeless tobacco: Never Used  Substance Use Topics  . Alcohol use: No      Objective:   Temp (!) 97.3 F (36.3 C) (Temporal)   Wt 126 lb (57.2 kg)   BMI 20.34 kg/m  Vitals:   07/14/19 0947  Temp: (!) 97.3 F (36.3 C)  TempSrc: Temporal  Weight: 126 lb (57.2 kg)  Body mass index is 20.34 kg/m.   Physical Exam Vitals signs reviewed.  Constitutional:      General: She is not in acute distress.    Appearance: Normal appearance. She is well-developed. She is not diaphoretic.  HENT:     Head: Normocephalic and atraumatic.  Neck:     Musculoskeletal: Neck supple.     Thyroid: No thyromegaly.  Cardiovascular:     Rate and Rhythm: Normal rate and regular rhythm.     Pulses: Normal pulses.     Heart sounds: Normal heart sounds. No murmur.  Pulmonary:     Effort: Pulmonary effort is normal. No respiratory distress.     Breath sounds: Normal breath sounds. No wheezing, rhonchi or rales.  Musculoskeletal:     Right lower leg: No edema.     Left lower leg: No edema.     Comments: No midline spinal tenderness.  Tenderness to palpation over coccyx and left ischium.  No SI joint tenderness to palpation.  Range of motion is intact.  Strength and sensation is intact in her lower extremities.  Gait intact.  Lymphadenopathy:     Cervical: No cervical adenopathy.  Skin:    General: Skin is warm and dry.     Capillary Refill: Capillary refill takes less than 2 seconds.     Findings: No rash.  Neurological:     Mental Status: She is alert and oriented to person, place, and time. Mental status is at baseline.  Psychiatric:        Mood and Affect: Mood normal.        Behavior:  Behavior normal.      No results found for any visits on 07/14/19.     Assessment & Plan   1. Coccyx pain - ongoing problem with TTP - discussed that even if she had a fracture, would not expect it to be painful for this long - discussed that pelvic floor PT may play a role - treatment with prednisone burst and taper to decrease inflammation -X-ray today to ensure no fracture or bony lesions -Consider Ortho referral in the future - DG Sacrum/Coccyx; Future  2. Ischial bursitis of left side -Tenderness over her ischial bursa on the left side on exam as well -This is likely contributing to some of her pain with sitting -Discussed importance of getting up and moving around -Discussed icing -Treatment with prednisone burst and taper as above  3. Need for influenza vaccination - Flu Vaccine QUAD High Dose(Fluad)   Meds ordered this encounter  Medications  . predniSONE (DELTASONE) 20 MG tablet    Sig: Take 60mg  PO daily x 2 days, then40mg  PO daily x 2 days, then 20mg  PO daily x 3 days    Dispense:  13 tablet    Refill:  0     Return if symptoms worsen or fail to improve.   The entirety of the information documented in the History of Present Illness, Review of Systems and Physical Exam were personally obtained by me. Portions of this information were initially documented by Ashley Royalty, CMA and reviewed by me for thoroughness and accuracy.    Vallorie Niccoli, Dionne Bucy, MD MPH [facility]

## 2019-07-14 NOTE — Telephone Encounter (Signed)
Patient advised as below.  

## 2019-07-14 NOTE — Telephone Encounter (Signed)
-----   Message from Virginia Crews, MD sent at 07/14/2019  9:37 AM EST ----- Normal mammogram. Repeat in 1 yr

## 2019-07-14 NOTE — Telephone Encounter (Signed)
-----   Message from Virginia Crews, MD sent at 07/14/2019 11:29 AM EST ----- Normal XRay

## 2019-07-14 NOTE — Telephone Encounter (Signed)
Pt advised.   Thanks,   -Laura  

## 2019-10-15 NOTE — Progress Notes (Signed)
Subjective:   Janet Craig is a 74 y.o. female who presents for Medicare Annual (Subsequent) preventive examination.    This visit is being conducted through telemedicine due to the COVID-19 pandemic. This patient has given me verbal consent via doximity to conduct this visit, patient states they are participating from their home address. Some vital signs may be absent or patient reported.    Patient identification: identified by name, DOB, and current address  Review of Systems:  N/A  Cardiac Risk Factors include: advanced age (>23men, >14 women);dyslipidemia     Objective:     Vitals: There were no vitals taken for this visit.  There is no height or weight on file to calculate BMI. Unable to obtain vitals due to visit being conducted via telephonically.   Advanced Directives 10/19/2019 03/31/2018 03/29/2017  Does Patient Have a Medical Advance Directive? Yes Yes Yes  Type of Paramedic of [place];Living will [place];Living will [place];Living will  Copy of [place] in Chart? No - copy requested No - copy requested No - copy requested    Tobacco Social History   Tobacco Use  Smoking Status Never Smoker  Smokeless Tobacco Never Used     Counseling given: Not Answered   Clinical Intake:  Pre-visit preparation completed: Yes  Pain : No/denies pain Pain Score: 0-No pain     Nutritional Risks: None Diabetes: No  How often do you need to have someone help you when you read instructions, pamphlets, or other written materials from your doctor or pharmacy?: 1 - Never  Interpreter Needed?: No  Information entered by :: [facility], LPN  Past Medical History:  Diagnosis Date  . Hyperlipidemia    Past Surgical History:  Procedure Laterality Date  . ABDOMINAL HYSTERECTOMY    . APPENDECTOMY    . COLONOSCOPY  12/2005  . EYE SURGERY    . LUMBAR MICRODISCECTOMY  11/2010  . TOTAL HIP  ARTHROPLASTY Left 02/24/13   Family History  Problem Relation Age of Onset  . Breast cancer Mother 86  . Lung cancer Mother   . CVA Father   . Lung cancer Father   . Melanoma Father   . Multiple sclerosis Sister   . Diabetes Sister   . Atrial fibrillation Sister   . Lupus Brother   . Colon cancer Paternal Aunt   . Lung cancer Maternal Grandfather    Social History   Socioeconomic History  . Marital status: Married    Spouse name: Not on file  . Number of children: 1  . Years of education: Not on file  . Highest education level: Master's degree (e.g., MA, MS, MEng, MEd, MSW, MBA)  Occupational History  . 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 Smoking status: Never Smoker  . Smokeless tobacco: Never Used  Substance and Sexual Activity  . Alcohol use: No  . Drug use: No  . Sexual activity: Yes  Other Topics Concern  . Not on file  Social History Narrative  . Not on file   Social Determinants of Health   Financial Resource Strain: Low Risk   . Difficulty of Paying Living Expenses: Not hard at all  Food Insecurity: No Food Insecurity  . Worried About Charity fundraiser in the Last Year: Never true  . Ran Out of Food in the Last Year: Never true  Transportation Needs: No Transportation Needs  . Lack of  Transportation (Medical): No  . Lack of Transportation (Non-Medical): No  Physical Activity: Inactive  . Days of Exercise per Week: 0 days  . Minutes of Exercise per Session: 0 min  Stress: No Stress Concern Present  . Feeling of Stress : Not at all  Social Connections: Slightly Isolated  . Frequency of Communication with Friends and Family: More than three times a week  . Frequency of Social Gatherings with Friends and Family: More than three times a week  . Attends Religious Services: More than 4 times per year  . Active Member of Clubs or Organizations: No  . Attends Archivist Meetings: Never  . Marital Status: Married     Outpatient Encounter Medications as of 10/19/2019  Medication Sig  . levothyroxine (SYNTHROID) 50 MCG tablet Take 1 tablet (50 mcg total) by mouth daily.  . Multiple Vitamin tablet Take 1 tablet by mouth daily.   . Omega-3 Fatty Acids (FISH OIL MAXIMUM STRENGTH) 1200 MG CPDR Take 1,200 mg by mouth daily.   Marland Kitchen levothyroxine (SYNTHROID, LEVOTHROID) 75 MCG tablet Take 1 tablet (75 mcg total) by mouth daily. (Patient not taking: Reported on 10/19/2019)  . predniSONE (DELTASONE) 20 MG tablet Take 60mg  PO daily x 2 days, then40mg  PO daily x 2 days, then 20mg  PO daily x 3 days (Patient not taking: Reported on 10/19/2019)   No facility-administered encounter medications on file as of 10/19/2019.    Activities of Daily Living In your present state of health, do you have any difficulty performing the following activities: 10/19/2019  Hearing? N  Vision? N  Difficulty concentrating or making decisions? N  Walking or climbing stairs? N  Dressing or bathing? N  Doing errands, shopping? N  Preparing Food and eating ? N  Using the Toilet? N  In the past six months, have you accidently leaked urine? N  Do you have problems with loss of bowel control? N  Managing your Medications? N  Managing your Finances? N  Housekeeping or managing your Housekeeping? N  Some recent data might be hidden    Patient Care Team: Virginia Crews, MD as PCP - General (Family Medicine) Birder Robson, MD as Referring Physician (Ophthalmology) Jerrol Banana., MD (Family Medicine)    Assessment:   This is a routine wellness examination for Janet Craig.  Exercise Activities and Dietary recommendations Current Exercise Habits: The patient does not participate in regular exercise at present, Exercise limited by: None identified  Goals    . Exercise 3x per week (30 min per time)     Recommend to exercise for 3 days a week for at least 30 minutes at a time.        Fall Risk: Fall Risk  10/19/2019 03/31/2018  03/29/2017 10/25/2015  Falls in the past year? 0 No No No  Number falls in past yr: 0 - - -  Injury with Fall? 0 - - -    FALL RISK PREVENTION PERTAINING TO THE HOME:  Any stairs in or around the home? Yes  If so, are there any without handrails? No   Home free of loose throw rugs in walkways, pet beds, electrical cords, etc? Yes  Adequate lighting in your home to reduce risk of falls? Yes   ASSISTIVE DEVICES UTILIZED TO PREVENT FALLS:  Life alert? No  Use of a cane, walker or w/c? No  Grab bars in the bathroom? No  Shower chair or bench in shower? No  Elevated toilet seat or a handicapped  toilet? No    TIMED UP AND GO:  Was the test performed? No .    Depression Screen PHQ 2/9 Scores 10/19/2019 03/31/2018 03/29/2017 03/29/2017  PHQ - 2 Score 0 0 0 0  PHQ- 9 Score - - 0 -     Cognitive Function     6CIT Screen 10/19/2019 03/29/2017  What Year? 0 points 0 points  What month? 0 points 0 points  What time? 0 points 0 points  Count back from 20 0 points 0 points  Months in reverse 0 points 0 points  Repeat phrase 0 points 0 points  Total Score 0 0    Immunization History  Administered Date(s) Administered  . Fluad Quad(high Dose 65+) 07/14/2019  . Influenza, High Dose Seasonal PF 07/09/2017, 10/03/2018  . Pneumococcal Conjugate-13 10/20/2014  . Pneumococcal Polysaccharide-23 10/25/2015    Qualifies for Shingles Vaccine? Yes . Due for Shingrix. Pt has been advised to call insurance company to determine out of pocket expense. Advised may also receive vaccine at local pharmacy or Health Dept. Verbalized acceptance and understanding.  Tdap: Although this vaccine is not a covered service during a Wellness Exam, does the patient still wish to receive this vaccine today?  No . Advised may receive this vaccine at local pharmacy or Health Dept. Aware to provide a copy of the vaccination record if obtained from local pharmacy or Health Dept. Verbalized acceptance and  understanding.  Flu Vaccine: Up to date  Pneumococcal Vaccine: Completed series  Screening Tests Health Maintenance  Topic Date Due  . TETANUS/TDAP  03/11/2027 (Originally 02/17/1965)  . MAMMOGRAM  07/12/2021  . DEXA SCAN  04/29/2023  . COLONOSCOPY  12/06/2024  . INFLUENZA VACCINE  Completed  . Hepatitis C Screening  Completed  . PNA vac Low Risk Adult  Completed    Cancer Screenings:  Colorectal Screening: Completed 12/07/14. Repeat every 10 years.   Mammogram: Completed 07/13/19. Repeat every 1-2 years as advised.   Bone Density: Completed 04/28/18. Results reflect OSTEOPENIA. Repeat every 5 years.   Lung Cancer Screening: (Low Dose CT Chest recommended if Age 40-80 years, 30 pack-year currently smoking OR have quit w/in 15years.) does not qualify.   Additional Screening:  Hepatitis C Screening: Up to date  Vision Screening: Recommended annual ophthalmology exams for early detection of glaucoma and other disorders of the eye.  Dental Screening: Recommended annual dental exams for proper oral hygiene  Community Resource Referral:  CRR required this visit?  No       Plan:  I have personally reviewed and addressed the Medicare Annual Wellness questionnaire and have noted the following in the patient's chart:  A. Medical and social history B. Use of alcohol, tobacco or illicit drugs  C. Current medications and supplements D. Functional ability and status E.  Nutritional status F.  Physical activity G. Advance directives H. List of other physicians I.  Hospitalizations, surgeries, and ER visits in previous 12 months J.  [place] such as hearing and vision if needed, cognitive and depression L. Referrals and appointments   In addition, I have reviewed and discussed with patient certain preventive protocols, quality metrics, and best practice recommendations. A written personalized care plan for preventive services as well as general preventive health  recommendations were provided to patient. Nurse Health Advisor  Signed,    Hercules Hasler [place], [place]  [id number] Nurse Health Advisor   Nurse Notes: None.

## 2019-10-19 ENCOUNTER — Other Ambulatory Visit: Payer: Self-pay

## 2019-10-19 ENCOUNTER — Ambulatory Visit (INDEPENDENT_AMBULATORY_CARE_PROVIDER_SITE_OTHER): Payer: Medicare PPO

## 2019-10-19 DIAGNOSIS — Z0000 Encounter for general adult medical examination without abnormal findings: Secondary | ICD-10-CM

## 2019-10-19 NOTE — Patient Instructions (Signed)
Janet Craig , Thank you for taking time to come for your Medicare Wellness Visit. I appreciate your ongoing commitment to your health goals. Please review the following plan we discussed and let me know if I can assist you in the future.   Screening recommendations/referrals: Colonoscopy: Up to date, due 02/2027 Mammogram: Up to date, due 07/2021 Bone Density: Up to date, due 04/2023 Recommended yearly ophthalmology/optometry visit for glaucoma screening and checkup Recommended yearly dental visit for hygiene and checkup  Vaccinations: Influenza vaccine: Up to date Pneumococcal vaccine: Completed series Tdap vaccine: Pt declines today.  Shingles vaccine: Pt declines today.     Advanced directives: Please bring a copy of your POA (Power of Attorney) and/or Living Will to your next appointment.   Conditions/risks identified: Recommend to exercise for 3 days a week for at least 30 minutes at a time.   Next appointment: None. Pt declined scheduling a CPE at this time or an AWV for 2022.   Preventive Care 55 Years and Older, Female Preventive care refers to lifestyle choices and visits with your health care provider that can promote health and wellness. What does preventive care include?  A yearly physical exam. This is also called an annual well check.  Dental exams once or twice a year.  Routine eye exams. Ask your health care provider how often you should have your eyes checked.  Personal lifestyle choices, including:  Daily care of your teeth and gums.  Regular physical activity.  Eating a healthy diet.  Avoiding tobacco and drug use.  Limiting alcohol use.  Practicing safe sex.  Taking low-dose aspirin every day.  Taking vitamin and mineral supplements as recommended by your health care provider. What happens during an annual well check? The services and screenings done by your health care provider during your annual well check will depend on your age, overall health,  lifestyle risk factors, and family history of disease. Counseling  Your health care provider may ask you questions about your:  Alcohol use.  Tobacco use.  Drug use.  Emotional well-being.  Home and relationship well-being.  Sexual activity.  Eating habits.  History of falls.  Memory and ability to understand (cognition).  Work and work Statistician.  Reproductive health. Screening  You may have the following tests or measurements:  Height, weight, and BMI.  Blood pressure.  Lipid and cholesterol levels. These may be checked every 5 years, or more frequently if you are over 52 years old.  Skin check.  Lung cancer screening. You may have this screening every year starting at age 38 if you have a 30-pack-year history of smoking and currently smoke or have quit within the past 15 years.  Fecal occult blood test (FOBT) of the stool. You may have this test every year starting at age 33.  Flexible sigmoidoscopy or colonoscopy. You may have a sigmoidoscopy every 5 years or a colonoscopy every 10 years starting at age 42.  Hepatitis C blood test.  Hepatitis B blood test.  Sexually transmitted disease (STD) testing.  Diabetes screening. This is done by checking your blood sugar (glucose) after you have not eaten for a while (fasting). You may have this done every 1-3 years.  Bone density scan. This is done to screen for osteoporosis. You may have this done starting at age 4.  Mammogram. This may be done every 1-2 years. Talk to your health care provider about how often you should have regular mammograms. Talk with your health care provider about your  test results, treatment options, and if necessary, the need for more tests. Vaccines  Your health care provider may recommend certain vaccines, such as:  Influenza vaccine. This is recommended every year.  Tetanus, diphtheria, and acellular pertussis (Tdap, Td) vaccine. You may need a Td booster every 10 years.  Zoster  vaccine. You may need this after age 55.  Pneumococcal 13-valent conjugate (PCV13) vaccine. One dose is recommended after age 74.  Pneumococcal polysaccharide (PPSV23) vaccine. One dose is recommended after age 87. Talk to your health care provider about which screenings and vaccines you need and how often you need them. This information is not intended to replace advice given to you by your health care provider. Make sure you discuss any questions you have with your health care provider. Document Released: 09/23/2015 Document Revised: 05/16/2016 Document Reviewed: 06/28/2015 Elsevier Interactive Patient Education  2017 [place] Prevention in the Home Falls can cause injuries. They can happen to people of all ages. There are many things you can do to make your home safe and to help prevent falls. What can I do on the outside of my home?  Regularly fix the edges of walkways and driveways and fix any cracks.  Remove anything that might make you trip as you walk through a door, such as a raised step or threshold.  Trim any bushes or trees on the path to your home.  Use bright outdoor lighting.  Clear any walking paths of anything that might make someone trip, such as rocks or tools.  Regularly check to see if handrails are loose or broken. Make sure that both sides of any steps have handrails.  Any raised decks and porches should have guardrails on the edges.  Have any leaves, snow, or ice cleared regularly.  Use sand or salt on walking paths during winter.  Clean up any spills in your garage right away. This includes oil or grease spills. What can I do in the bathroom?  Use night lights.  Install grab bars by the toilet and in the tub and shower. Do not use towel bars as grab bars.  Use non-skid mats or decals in the tub or shower.  If you need to sit down in the shower, use a plastic, non-slip stool.  Keep the floor dry. Clean up any water that spills on the  floor as soon as it happens.  Remove soap buildup in the tub or shower regularly.  Attach bath mats securely with double-sided non-slip rug tape.  Do not have throw rugs and other things on the floor that can make you trip. What can I do in the bedroom?  Use night lights.  Make sure that you have a light by your bed that is easy to reach.  Do not use any sheets or blankets that are too big for your bed. They should not hang down onto the floor.  Have a firm chair that has side arms. You can use this for support while you get dressed.  Do not have throw rugs and other things on the floor that can make you trip. What can I do in the kitchen?  Clean up any spills right away.  Avoid walking on wet floors.  Keep items that you use a lot in easy-to-reach places.  If you need to reach something above you, use a strong step stool that has a grab bar.  Keep electrical cords out of the way.  Do not use floor polish or wax that  makes floors slippery. If you must use wax, use non-skid floor wax.  Do not have throw rugs and other things on the floor that can make you trip. What can I do with my stairs?  Do not leave any items on the stairs.  Make sure that there are handrails on both sides of the stairs and use them. Fix handrails that are broken or loose. Make sure that handrails are as long as the stairways.  Check any carpeting to make sure that it is firmly attached to the stairs. Fix any carpet that is loose or worn.  Avoid having throw rugs at the top or bottom of the stairs. If you do have throw rugs, attach them to the floor with carpet tape.  Make sure that you have a light switch at the top of the stairs and the bottom of the stairs. If you do not have them, ask someone to add them for you. What else can I do to help prevent falls?  Wear shoes that:  Do not have high heels.  Have rubber bottoms.  Are comfortable and fit you well.  Are closed at the toe. Do not wear  sandals.  If you use a stepladder:  Make sure that it is fully opened. Do not climb a closed stepladder.  Make sure that both sides of the stepladder are locked into place.  Ask someone to hold it for you, if possible.  Clearly mark and make sure that you can see:  Any grab bars or handrails.  First and last steps.  Where the edge of each step is.  Use tools that help you move around (mobility aids) if they are needed. These include:  Canes.  Walkers.  Scooters.  Crutches.  Turn on the lights when you go into a dark area. Replace any light bulbs as soon as they burn out.  Set up your furniture so you have a clear path. Avoid moving your furniture around.  If any of your floors are uneven, fix them.  If there are any pets around you, be aware of where they are.  Review your medicines with your doctor. Some medicines can make you feel dizzy. This can increase your chance of falling. Ask your doctor what other things that you can do to help prevent falls. This information is not intended to replace advice given to you by your health care provider. Make sure you discuss any questions you have with your health care provider. Document Released: 06/23/2009 Document Revised: 02/02/2016 Document Reviewed: 10/01/2014 Elsevier Interactive Patient Education  2017 [employer].

## 2019-11-06 ENCOUNTER — Ambulatory Visit: Payer: Medicare PPO | Attending: Internal Medicine

## 2019-11-06 DIAGNOSIS — Z23 Encounter for immunization: Secondary | ICD-10-CM

## 2019-11-06 NOTE — Progress Notes (Signed)
   Covid-19 Vaccination Clinic  Name:  Janet Craig    MRN: [id number] DOB: 09/15/1945  11/06/2019  [name] was observed post Covid-19 immunization for 15 minutes without incidence. She was provided with Vaccine Information Sheet and instruction to access the V-Safe system.   [name] was instructed to call 911 with any severe reactions post vaccine: Marland Kitchen Difficulty breathing  . Swelling of your face and throat  . A fast heartbeat  . A bad rash all over your body  . Dizziness and weakness    Immunizations Administered    Name Date Dose VIS Date Route   Pfizer COVID-19 Vaccine 11/06/2019  9:28 AM 0.3 mL 08/21/2019 Intramuscular   Manufacturer: [place]   Lot: [id number]   [place]: [id number]

## 2019-11-20 ENCOUNTER — Other Ambulatory Visit: Payer: Self-pay | Admitting: Family Medicine

## 2019-11-20 DIAGNOSIS — E039 Hypothyroidism, unspecified: Secondary | ICD-10-CM

## 2019-11-20 MED ORDER — LEVOTHYROXINE SODIUM 50 MCG PO TABS: 50.0000 ug | ORAL_TABLET | Freq: Every day | ORAL | 3 refills | Status: DC

## 2019-11-20 NOTE — Telephone Encounter (Signed)
[facility] faxed refill request for the following medications:  levothyroxine (SYNTHROID) 50 MCG tablet   Last Rx: 05/26/2019 with 5 refills LOV: 07/14/2019 Please advise. Thanks TNP

## 2019-12-02 ENCOUNTER — Ambulatory Visit: Payer: Medicare PPO | Attending: Internal Medicine

## 2019-12-02 DIAGNOSIS — Z23 Encounter for immunization: Secondary | ICD-10-CM

## 2019-12-02 NOTE — Progress Notes (Signed)
   Covid-19 Vaccination Clinic  Name:  Janet Craig    MRN: [id number] DOB: [birth year]  12/02/2019  [name] was observed post Covid-19 immunization for 15 minutes without incident. She was provided with Vaccine Information Sheet and instruction to access the V-Safe system.   [name] was instructed to call 911 with any severe reactions post vaccine: Marland Kitchen Difficulty breathing  . Swelling of face and throat  . A fast heartbeat  . A bad rash all over body  . Dizziness and weakness   Immunizations Administered    Name Date Dose VIS Date Route   Pfizer COVID-19 Vaccine 12/02/2019  2:04 PM 0.3 mL 08/21/2019 Intramuscular   Manufacturer: [place]   Lot: [id number]   [place]: [id number]

## 2020-01-20 IMAGING — MG DIGITAL SCREENING BILAT W/ TOMO
8 series · 8 of 24 positions shown · non-contrast
Comparison: Previous exam(s).

CLINICAL DATA: Screening.

EXAM:
DIGITAL SCREENING BILATERAL MAMMOGRAM WITH TOMO AND CAD

[L MLO synth-2D]
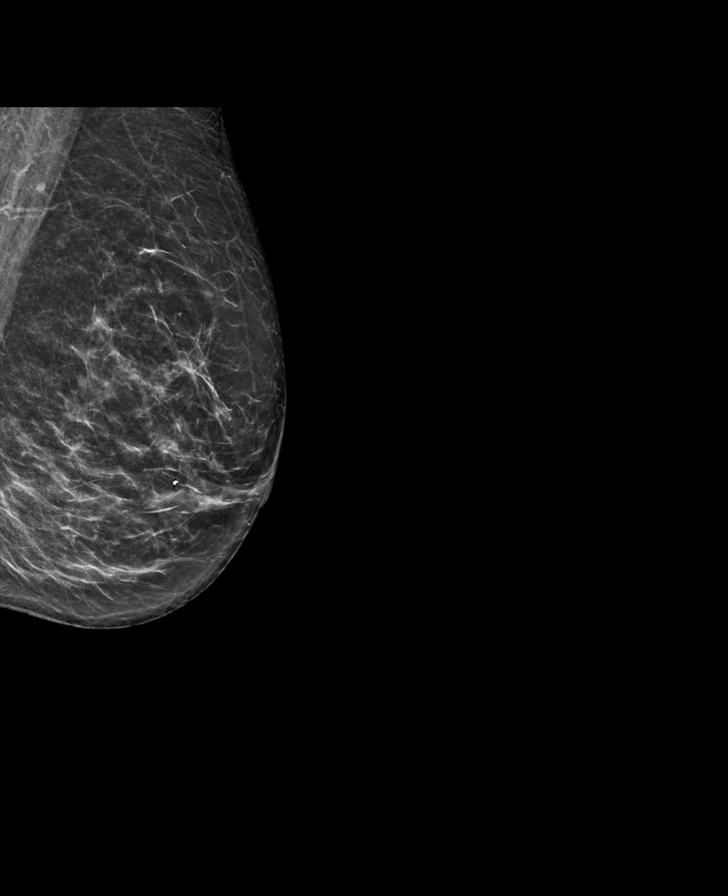

[R MLO synth-2D]
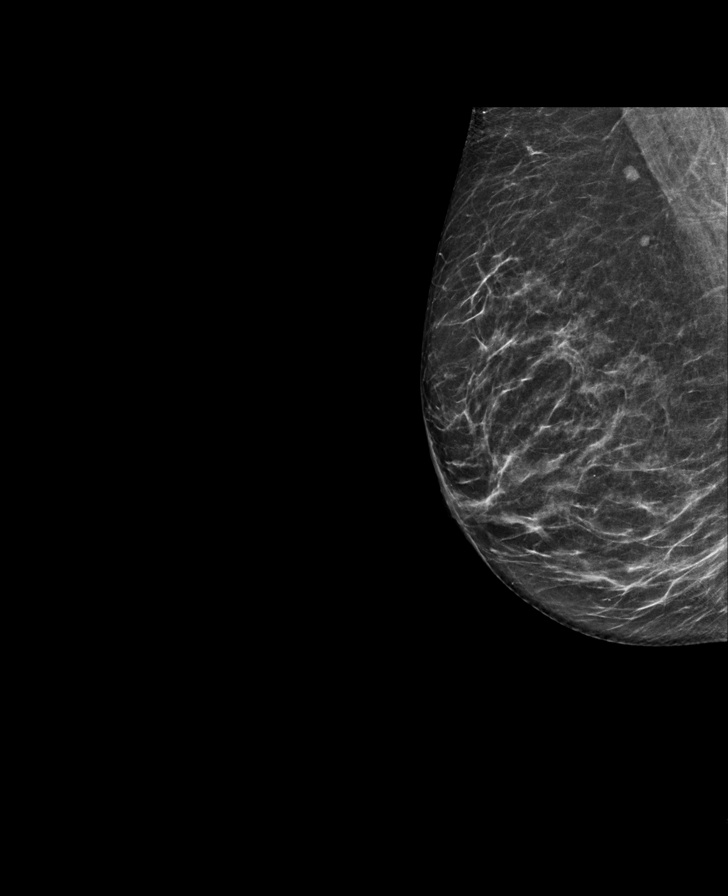

[R CC synth-2D]
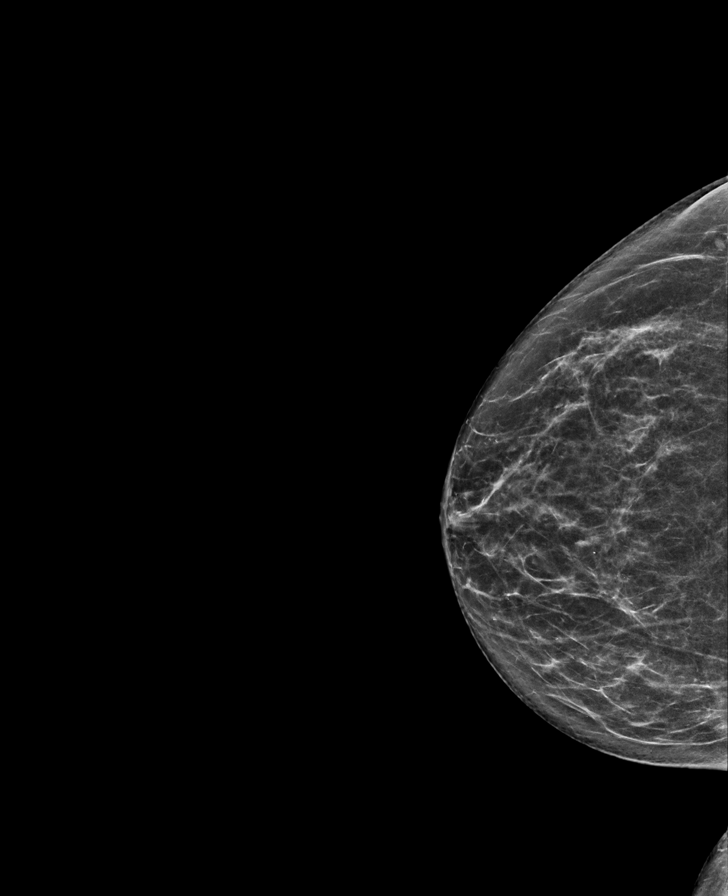

[L CC synth-2D]
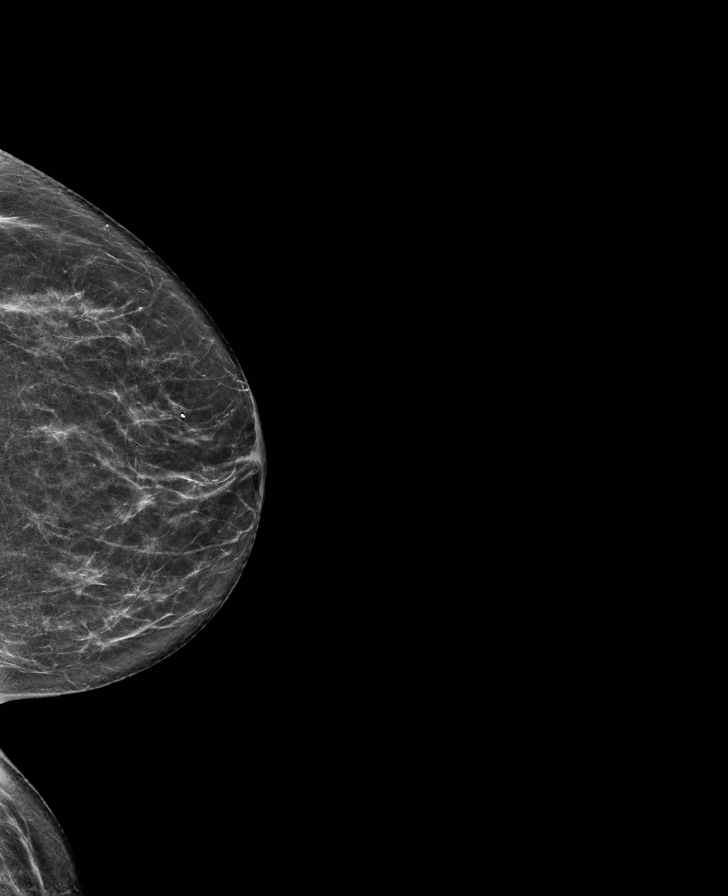

[L MLO tomo · tomo slice 31/62.0]
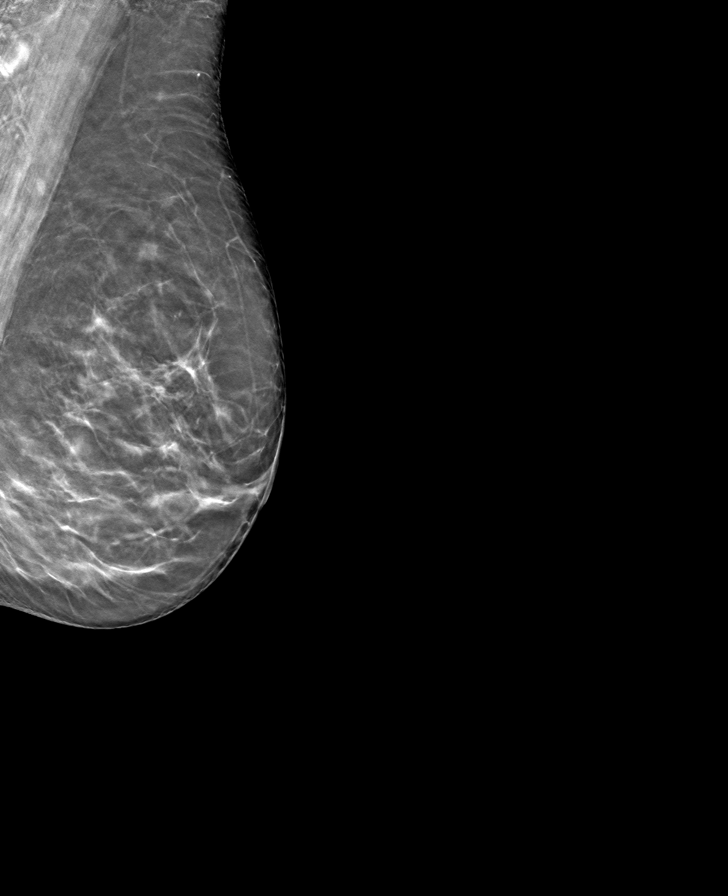

[L CC tomo · tomo slice 29/56.0]
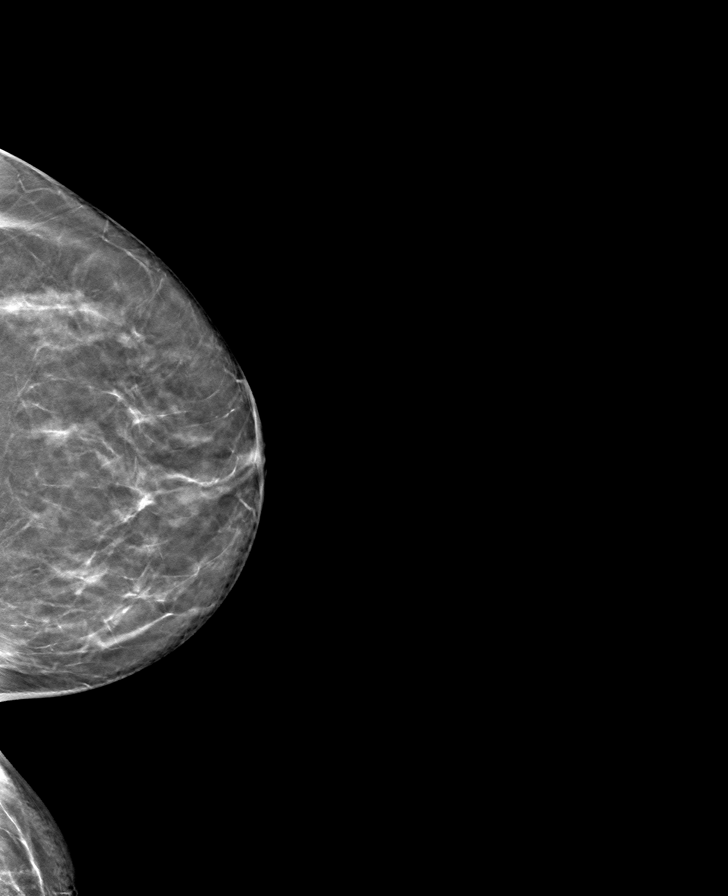

[R CC tomo · tomo slice 32/63.0]
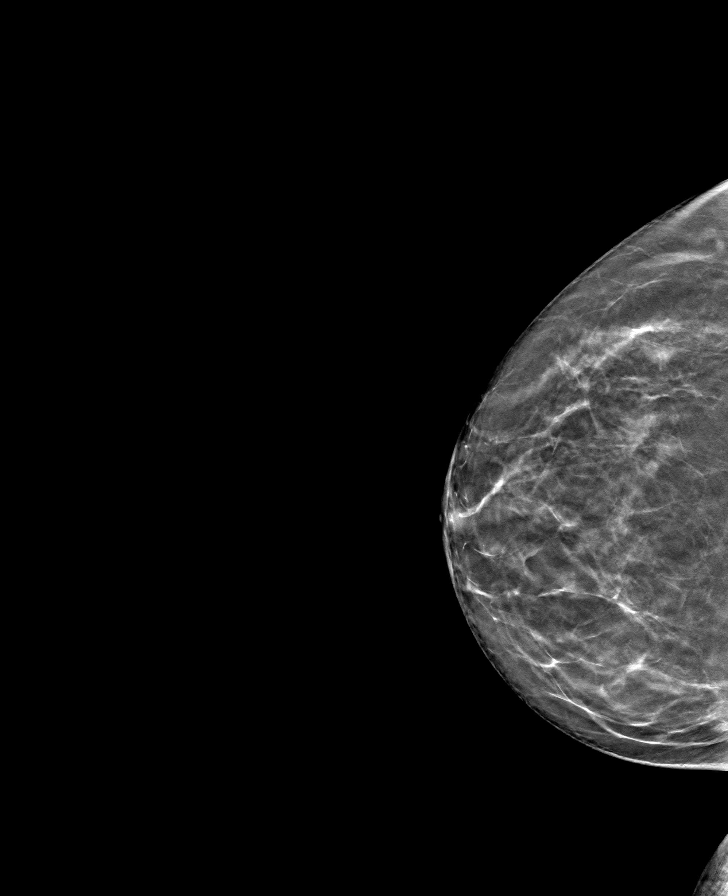

[R MLO tomo · tomo slice 31/61.0]
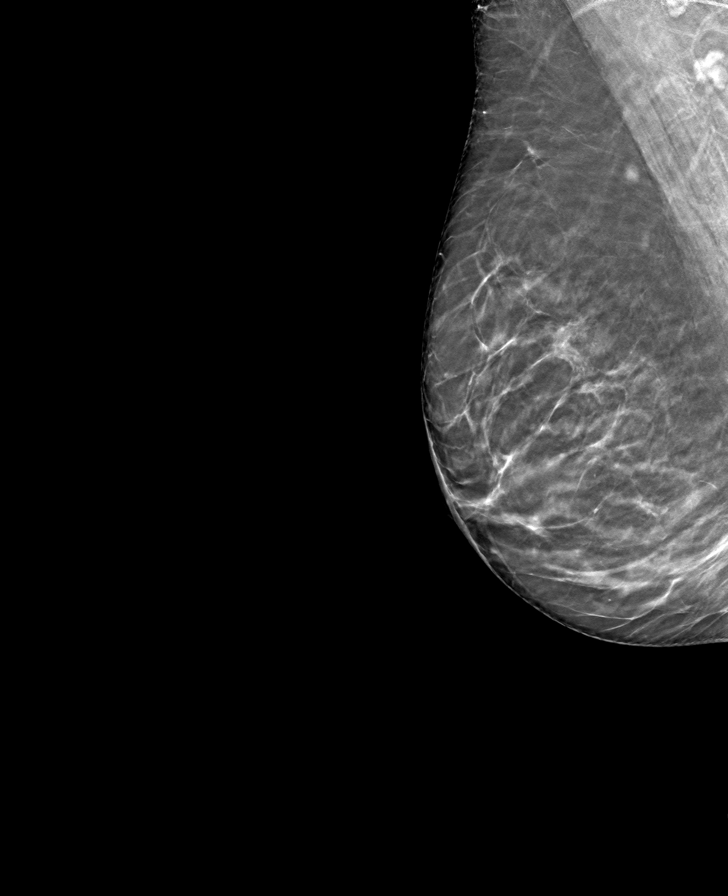

[8 of 24 positions shown; findings below may reference images not displayed]

ACR Breast Density Category b: There are scattered areas of
fibroglandular density.
FINDINGS: There are no findings suspicious for malignancy. Images were
processed with CAD.
IMPRESSION: No mammographic evidence of malignancy. A result letter of this
screening mammogram will be mailed directly to the patient.

RECOMMENDATION:
Screening mammogram in one year. (Code:[id number])

BI-RADS CATEGORY  1: Negative.

## 2020-01-25 ENCOUNTER — Encounter: Payer: Self-pay | Admitting: Family Medicine

## 2020-01-25 ENCOUNTER — Ambulatory Visit (INDEPENDENT_AMBULATORY_CARE_PROVIDER_SITE_OTHER): Payer: Medicare PPO | Admitting: Family Medicine

## 2020-01-25 ENCOUNTER — Other Ambulatory Visit: Payer: Self-pay

## 2020-01-25 VITALS — BP 118/76 | HR 56 | Temp 96.8°F | Resp 16 | Ht 64.0 in | Wt 118.6 lb

## 2020-01-25 DIAGNOSIS — E785 Hyperlipidemia, unspecified: Secondary | ICD-10-CM

## 2020-01-25 DIAGNOSIS — D1724 Benign lipomatous neoplasm of skin and subcutaneous tissue of left leg: Secondary | ICD-10-CM | POA: Diagnosis not present

## 2020-01-25 DIAGNOSIS — Z0000 Encounter for general adult medical examination without abnormal findings: Secondary | ICD-10-CM

## 2020-01-25 DIAGNOSIS — R7303 Prediabetes: Secondary | ICD-10-CM

## 2020-01-25 DIAGNOSIS — E039 Hypothyroidism, unspecified: Secondary | ICD-10-CM

## 2020-01-25 NOTE — Assessment & Plan Note (Signed)
Discussed importance of healthy weight management Discussed diet and exercise. Recheck A1c, follow up pending lab result.

## 2020-01-25 NOTE — Assessment & Plan Note (Signed)
Normal physical exam today Check labs today Follow up in 6 months

## 2020-01-25 NOTE — Patient Instructions (Signed)
Please call your insurance to check on coverage for Shingles vaccine.   Preventive Care 73 Years and Older, Female Preventive care refers to lifestyle choices and visits with your health care provider that can promote health and wellness. This includes:  A yearly physical exam. This is also called an annual well check.  Regular dental and eye exams.  Immunizations.  Screening for certain conditions.  Healthy lifestyle choices, such as diet and exercise. What can I expect for my preventive care visit? Physical exam Your health care provider will check:  Height and weight. These may be used to calculate body mass index (BMI), which is a measurement that tells if you are at a healthy weight.  Heart rate and blood pressure.  Your skin for abnormal spots. Counseling Your health care provider may ask you questions about:  Alcohol, tobacco, and drug use.  Emotional well-being.  Home and relationship well-being.  Sexual activity.  Eating habits.  History of falls.  Memory and ability to understand (cognition).  Work and work Statistician.  Pregnancy and menstrual history. What immunizations do I need?  Influenza (flu) vaccine  This is recommended every year. Tetanus, diphtheria, and pertussis (Tdap) vaccine  You may need a Td booster every 10 years. Varicella (chickenpox) vaccine  You may need this vaccine if you have not already been vaccinated. Zoster (shingles) vaccine  You may need this after age 53. Pneumococcal conjugate (PCV13) vaccine  One dose is recommended after age 105. Pneumococcal polysaccharide (PPSV23) vaccine  One dose is recommended after age 41. Measles, mumps, and rubella (MMR) vaccine  You may need at least one dose of MMR if you were born in 1957 or later. You may also need a second dose. Meningococcal conjugate (MenACWY) vaccine  You may need this if you have certain conditions. Hepatitis A vaccine  You may need this if you have  certain conditions or if you travel or work in places where you may be exposed to hepatitis A. Hepatitis B vaccine  You may need this if you have certain conditions or if you travel or work in places where you may be exposed to hepatitis B. Haemophilus influenzae type b (Hib) vaccine  You may need this if you have certain conditions. You may receive vaccines as individual doses or as more than one vaccine together in one shot (combination vaccines). Talk with your health care provider about the risks and benefits of combination vaccines. What tests do I need? Blood tests  Lipid and cholesterol levels. These may be checked every 5 years, or more frequently depending on your overall health.  Hepatitis C test.  Hepatitis B test. Screening  Lung cancer screening. You may have this screening every year starting at age 69 if you have a 30-pack-year history of smoking and currently smoke or have quit within the past 15 years.  Colorectal cancer screening. All adults should have this screening starting at age 41 and continuing until age 66. Your health care provider may recommend screening at age 52 if you are at increased risk. You will have tests every 1-10 years, depending on your results and the type of screening test.  Diabetes screening. This is done by checking your blood sugar (glucose) after you have not eaten for a while (fasting). You may have this done every 1-3 years.  Mammogram. This may be done every 1-2 years. Talk with your health care provider about how often you should have regular mammograms.  BRCA-related cancer screening. This may be done  if you have a family history of breast, ovarian, tubal, or peritoneal cancers. Other tests  Sexually transmitted disease (STD) testing.  Bone density scan. This is done to screen for osteoporosis. You may have this done starting at age 48. Follow these instructions at home: Eating and drinking  Eat a diet that includes fresh fruits  and vegetables, whole grains, lean protein, and low-fat dairy products. Limit your intake of foods with high amounts of sugar, saturated fats, and salt.  Take vitamin and mineral supplements as recommended by your health care provider.  Do not drink alcohol if your health care provider tells you not to drink.  If you drink alcohol: ? Limit how much you have to 0-1 drink a day. ? Be aware of how much alcohol is in your drink. In the [place] one drink equals one 12 oz bottle of beer (355 mL), one 5 oz glass of wine (148 mL), or one 1 oz glass of hard liquor (44 mL). Lifestyle  Take daily care of your teeth and gums.  Stay active. Exercise for at least 30 minutes on 5 or more days each week.  Do not use any products that contain nicotine or tobacco, such as cigarettes, e-cigarettes, and chewing tobacco. If you need help quitting, ask your health care provider.  If you are sexually active, practice safe sex. Use a condom or other form of protection in order to prevent STIs (sexually transmitted infections).  Talk with your health care provider about taking a low-dose aspirin or statin. What's next?  Go to your health care provider once a year for a well check visit.  Ask your health care provider how often you should have your eyes and teeth checked.  Stay up to date on all vaccines. This information is not intended to replace advice given to you by your health care provider. Make sure you discuss any questions you have with your health care provider. Document Revised: 08/21/2018 Document Reviewed: 08/21/2018 Elsevier Patient Education  2020 [employer].

## 2020-01-25 NOTE — Assessment & Plan Note (Addendum)
Previously well controlled Continue Synthroid 87mcg daily Recheck TSH and adjust Synthroid as indicated

## 2020-01-25 NOTE — Progress Notes (Signed)
Annual Wellness Visit     Patient: Janet Craig, Female    DOB: 1946/01/21, 74 y.o.   MRN: [id number] Visit Date: 01/25/2020  I,Sulibeya S Dimas,acting as a scribe for Lavon Paganini, MD.,have documented all relevant documentation on the behalf of Lavon Paganini, MD,as directed by  Lavon Paganini, MD while in the presence of Lavon Paganini, MD.  [phone] Provider: Lavon Paganini, MD   Chief Complaint  Patient presents with  . Annual Exam   Subjective    Janet Craig is a 74 y.o. female who presents today for her Annual Wellness Visit. She reports consuming a general diet. Home exercise routine includes doing yard work. She generally feels well. She reports sleeping well. She does have additional problems to discuss today.   HPI 10/19/2019 AWV  07/13/2019 Mammogram-BI-RADS 1 04/28/2018 BMD-osteopenia 12/07/2014 Colonoscopy-Diverticulosis  Patient Active Problem List   Diagnosis Date Noted  . Annual physical exam 01/25/2020  . Osteopenia 04/02/2018  . Glaucoma 10/11/2015  . Prediabetes 10/11/2015  . HLD (hyperlipidemia) 10/11/2015  . Adult hypothyroidism 10/11/2015  . Cannot sleep 10/11/2015  . Degenerative arthritis of hip 10/11/2015   Past Medical History:  Diagnosis Date  . Hyperlipidemia    Past Surgical History:  Procedure Laterality Date  . ABDOMINAL HYSTERECTOMY    . APPENDECTOMY    . COLONOSCOPY  12/2005  . EYE SURGERY    . LUMBAR MICRODISCECTOMY  11/2010  . TOTAL HIP ARTHROPLASTY Left 02/24/13   Social History   Tobacco Use  . Smoking status: Never Smoker  . Smokeless tobacco: Never Used  Substance Use Topics  . Alcohol use: No  . Drug use: No       Medications: Outpatient Medications Prior to Visit  Medication Sig  . levothyroxine (SYNTHROID) 50 MCG tablet Take 1 tablet (50 mcg total) by mouth daily. Please schedule office visit before any future refills.  . Multiple Vitamin tablet Take 1 tablet by mouth daily.   .  Omega-3 Fatty Acids (FISH OIL MAXIMUM STRENGTH) 1200 MG CPDR Take 1,200 mg by mouth daily.   . [DISCONTINUED] levothyroxine (SYNTHROID, LEVOTHROID) 75 MCG tablet Take 1 tablet (75 mcg total) by mouth daily. (Patient not taking: Reported on 10/19/2019)  . [DISCONTINUED] predniSONE (DELTASONE) 20 MG tablet Take 60mg  PO daily x 2 days, then40mg  PO daily x 2 days, then 20mg  PO daily x 3 days (Patient not taking: Reported on 10/19/2019)   No facility-administered medications prior to visit.    Allergies  Allergen Reactions  . Neomycin-Bacitracin Zn-Polymyx   . Penicillins   . Miconazole Nitrate Rash    Patient Care Team: Virginia Crews, MD as PCP - General (Family Medicine) Birder Robson, MD as Referring Physician (Ophthalmology) Jerrol Banana., MD (Family Medicine)  Review of Systems  Constitutional: Negative.   HENT: Negative.   Eyes: Negative.   Respiratory: Negative.   Cardiovascular: Negative.   Gastrointestinal: Negative.   Endocrine: Negative.   Genitourinary: Negative.   Musculoskeletal: Negative.   Skin: Negative.   Allergic/Immunologic: Negative.   Neurological: Negative.   Hematological: Negative.   Psychiatric/Behavioral: Negative.     Last CBC Lab Results  Component Value Date   WBC 4.9 07/25/2018   HGB 12.4 07/25/2018   HCT 37.5 07/25/2018   MCV 87 07/25/2018   MCH 28.9 07/25/2018   RDW 12.8 07/25/2018   PLT 336 0000000   Last metabolic panel Lab Results  Component Value Date   GLUCOSE 99 07/25/2018   NA 139  07/25/2018   K 4.2 07/25/2018   CL 100 07/25/2018   CO2 24 07/25/2018   BUN 12 07/25/2018   CREATININE 0.65 07/25/2018   GFRNONAA 90 07/25/2018   GFRAA 103 07/25/2018   CALCIUM 9.5 07/25/2018   PROT 7.0 07/25/2018   ALBUMIN 4.4 07/25/2018   LABGLOB 2.6 07/25/2018   AGRATIO 1.7 07/25/2018   BILITOT 0.7 07/25/2018   ALKPHOS 61 07/25/2018   AST 19 07/25/2018   ALT 12 07/25/2018   ANIONGAP 6 (L) 02/25/2013   Last  lipids Lab Results  Component Value Date   CHOL 228 (H) 07/25/2018   HDL 55 07/25/2018   LDLCALC 157 (H) 07/25/2018   TRIG 80 07/25/2018   CHOLHDL 4.1 07/25/2018   Last hemoglobin A1c Lab Results  Component Value Date   HGBA1C 6.1 (H) 10/03/2018   Last thyroid functions Lab Results  Component Value Date   TSH 0.685 10/03/2018   T4TOTAL 5.4 04/19/2017      Objective    Vitals: BP 118/76 (BP Location: Left Arm, Patient Position: Sitting, Cuff Size: Normal)   Pulse (!) 56   Temp (!) 96.8 F (36 C) (Temporal)   Resp 16   Ht 5\' 4"  (1.626 m)   Wt 118 lb 9.6 oz (53.8 kg)   BMI 20.36 kg/m  BP Readings from Last 3 Encounters:  01/25/20 118/76  07/14/19 106/69  10/03/18 113/74   Wt Readings from Last 3 Encounters:  01/25/20 118 lb 9.6 oz (53.8 kg)  07/14/19 126 lb (57.2 kg)  10/03/18 134 lb (60.8 kg)      Physical Exam Vitals reviewed.  Constitutional:      General: She is not in acute distress.    Appearance: Normal appearance. She is well-developed. She is not diaphoretic.  HENT:     Head: Normocephalic and atraumatic.     Right Ear: Tympanic membrane, ear canal and external ear normal.     Left Ear: Tympanic membrane, ear canal and external ear normal.     Nose: Nose normal.     Mouth/Throat:     Mouth: Mucous membranes are moist.     Pharynx: Oropharynx is clear. No oropharyngeal exudate.  Eyes:     General: No scleral icterus.    Conjunctiva/sclera: Conjunctivae normal.     Pupils: Pupils are equal, round, and reactive to light.  Neck:     Thyroid: No thyromegaly.  Cardiovascular:     Rate and Rhythm: Normal rate and regular rhythm.     Pulses: Normal pulses.     Heart sounds: Normal heart sounds. No murmur.  Pulmonary:     Effort: Pulmonary effort is normal. No respiratory distress.     Breath sounds: Normal breath sounds. No wheezing or rales.  Abdominal:     General: There is no distension.     Palpations: Abdomen is soft.     Tenderness: There  is no abdominal tenderness. There is no guarding or rebound.  Musculoskeletal:        General: No deformity.     Cervical back: Neck supple.     Right lower leg: No edema.     Left lower leg: No edema.  Lymphadenopathy:     Cervical: No cervical adenopathy.  Skin:    General: Skin is warm and dry.     Findings: No rash.     Comments: Small mobile lipoma on L shin  Neurological:     Mental Status: She is alert and oriented to person, place,  and time. Mental status is at baseline.  Psychiatric:        Mood and Affect: Mood normal.        Behavior: Behavior normal.        Thought Content: Thought content normal.     Most recent functional status assessment: In your present state of health, do you have any difficulty performing the following activities: 10/19/2019  Hearing? N  Vision? N  Difficulty concentrating or making decisions? N  Walking or climbing stairs? N  Dressing or bathing? N  Doing errands, shopping? N  Preparing Food and eating ? N  Using the Toilet? N  In the past six months, have you accidently leaked urine? N  Do you have problems with loss of bowel control? N  Managing your Medications? N  Managing your Finances? N  Housekeeping or managing your Housekeeping? N  Some recent data might be hidden    Most recent fall risk assessment: Fall Risk  10/19/2019  Falls in the past year? 0  Number falls in past yr: 0  Injury with Fall? 0     Most recent depression screenings: PHQ 2/9 Scores 10/19/2019 03/31/2018  PHQ - 2 Score 0 0  PHQ- 9 Score - -    Most recent cognitive screening: 6CIT Screen 10/19/2019  What Year? 0 points  What month? 0 points  What time? 0 points  Count back from 20 0 points  Months in reverse 0 points  Repeat phrase 0 points  Total Score 0   Audit-C Alcohol Use Screening   Alcohol Use Disorder Test (AUDIT) 04/02/2018 10/19/2019  1. How often do you have a drink containing alcohol? 0 0  2. How many drinks containing alcohol do you have  on a typical day when you are drinking? 0 0  3. How often do you have six or more drinks on one occasion? 0 0  AUDIT-C Score 0 0    A score of 3 or more in women, and 4 or more in men indicates increased risk for alcohol abuse, EXCEPT if all of the points are from question 1  No results found for any visits on 01/25/20.  Assessment & Plan     Annual wellness visit done today including the all of the following: Reviewed patient's Family Medical History Reviewed and updated list of patient's medical providers Assessment of cognitive impairment was done Assessed patient's functional ability Established a written schedule for health screening [place] Completed and Reviewed  Exercise Activities and Dietary recommendations Goals    . Exercise 3x per week (30 min per time)     Recommend to exercise for 3 days a week for at least 30 minutes at a time.        Immunization History  Administered Date(s) Administered  . Fluad Quad(high Dose 65+) 07/14/2019  . Influenza, High Dose Seasonal PF 07/09/2017, 10/03/2018  . PFIZER SARS-COV-2 Vaccination 11/06/2019, 12/02/2019  . Pneumococcal Conjugate-13 10/20/2014  . Pneumococcal Polysaccharide-23 10/25/2015    Health Maintenance  Topic Date Due  . TETANUS/TDAP  03/11/2027 (Originally 02/17/1965)  . INFLUENZA VACCINE  04/10/2020  . MAMMOGRAM  07/12/2021  . DEXA SCAN  04/29/2023  . COLONOSCOPY  12/06/2024  . COVID-19 Vaccine  Completed  . Hepatitis C Screening  Completed  . PNA vac Low Risk Adult  Completed     Discussed health benefits of physical activity, and encouraged her to engage in regular exercise appropriate for her age and condition.  Problem List Items Addressed This Visit      Endocrine   Adult hypothyroidism    Previously well controlled Continue Synthroid 15mcg daily Recheck TSH and adjust Synthroid as indicated        Relevant Orders   TSH     Other   Prediabetes    Discussed  importance of healthy weight management Discussed diet and exercise. Recheck A1c, follow up pending lab result.       Relevant Orders   Hemoglobin A1c   HLD (hyperlipidemia)    Reviewed last lipid panel Not currently on a statin Recheck FLP and CMP Discussed diet and exercise Follow up in 6 months       Relevant Orders   Comprehensive metabolic panel   Lipid Panel With LDL/HDL Ratio   Annual physical exam - Primary    Normal physical exam today Check labs today Follow up in 6 months       Relevant Orders   Comprehensive metabolic panel   Hemoglobin A1c   Lipid Panel With LDL/HDL Ratio   TSH    Other Visit Diagnoses    Lipoma of left lower extremity        - continue to monitor - reassurance given   Return in about 6 months (around 07/27/2020) for chronic disease f/u.     I, Lavon Paganini, MD, have reviewed all documentation for this visit. The documentation on 01/25/20 for the exam, diagnosis, procedures, and orders are all accurate and complete.   Debbora Ang, Dionne Bucy, MD, MPH [facility]

## 2020-01-25 NOTE — Assessment & Plan Note (Signed)
Reviewed last lipid panel Not currently on a statin Recheck FLP and CMP Discussed diet and exercise Follow up in 6 months   

## 2020-01-26 ENCOUNTER — Telehealth: Payer: Self-pay

## 2020-01-26 LAB — COMPREHENSIVE METABOLIC PANEL
ALT: 13 IU/L (ref 0–32)
AST: 20 IU/L (ref 0–40)
Albumin/Globulin Ratio: 1.8 (ref 1.2–2.2)
Albumin: 4.6 g/dL (ref 3.7–4.7)
Alkaline Phosphatase: 67 IU/L (ref 48–121)
BUN/Creatinine Ratio: 16 (ref 12–28)
BUN: 10 mg/dL (ref 8–27)
Bilirubin Total: 0.5 mg/dL (ref 0.0–1.2)
CO2: 22 mmol/L (ref 20–29)
Calcium: 9.5 mg/dL (ref 8.7–10.3)
Chloride: 102 mmol/L (ref 96–106)
Creatinine, Ser: 0.64 mg/dL (ref 0.57–1.00)
GFR calc Af Amer: 103 mL/min/{1.73_m2} (ref >59)
GFR calc non Af Amer: 89 mL/min/{1.73_m2} (ref >59)
Globulin, Total: 2.5 g/dL (ref 1.5–4.5)
Glucose: 97 mg/dL (ref 65–99)
Potassium: 4.1 mmol/L (ref 3.5–5.2)
Sodium: 139 mmol/L (ref 134–144)
Total Protein: 7.1 g/dL (ref 6.0–8.5)

## 2020-01-26 LAB — LIPID PANEL WITH LDL/HDL RATIO
Cholesterol, Total: 206 mg/dL — ABNORMAL HIGH (ref 100–199)
HDL: 50 mg/dL (ref >39)
LDL Chol Calc (NIH): 143 mg/dL — ABNORMAL HIGH (ref 0–99)
LDL/HDL Ratio: 2.9 ratio (ref 0.0–3.2)
Triglycerides: 72 mg/dL (ref 0–149)
VLDL Cholesterol Cal: 13 mg/dL (ref 5–40)

## 2020-01-26 LAB — TSH: TSH: 2.44 u[IU]/mL (ref 0.450–4.500)

## 2020-01-26 LAB — HEMOGLOBIN A1C
Est. average glucose Bld gHb Est-mCnc: 126 mg/dL
Hgb A1c MFr Bld: 6 % — ABNORMAL HIGH (ref 4.8–5.6)

## 2020-01-26 NOTE — Telephone Encounter (Signed)
LMTCB, PEC may give result.  

## 2020-01-26 NOTE — Telephone Encounter (Signed)
-----   Message from Virginia Crews, MD sent at 01/26/2020  8:06 AM EDT ----- Normal/stable labs.

## 2020-01-27 ENCOUNTER — Telehealth: Payer: Self-pay | Admitting: Family Medicine

## 2020-01-27 NOTE — Telephone Encounter (Signed)
Attempted to call patient with lab result- left message to return call to office.

## 2020-01-27 NOTE — Telephone Encounter (Signed)
Patient returned call and was read lab note by Dr Brita Romp written 01/25/20. Patient verbalized understanding.

## 2020-02-05 NOTE — Telephone Encounter (Signed)
Patient advised as below.  

## 2020-04-07 NOTE — Progress Notes (Signed)
Established patient visit   Patient: Janet Craig   DOB: 05-17-1946   74 y.o. Female  MRN: [id number] Visit Date: 04/08/2020  Today's healthcare provider: Mar Daring, PA-C   Chief Complaint  Patient presents with  . Perineum tear   Subjective    HPI  Patient here for a tear in the perineum area. Reports no order symptoms or issues. She reports she was standing at the sink in her kitchen and felt something like a bite. She went and looked and saw something like a tear that was red and looked like it was bleeding without noticing any blood. She denies any pain. No urinary symptoms. No constipation or diarrhea.   Patient Active Problem List   Diagnosis Date Noted  . Annual physical exam 01/25/2020  . Osteopenia 04/02/2018  . Glaucoma 10/11/2015  . Prediabetes 10/11/2015  . HLD (hyperlipidemia) 10/11/2015  . Adult hypothyroidism 10/11/2015  . Cannot sleep 10/11/2015  . Degenerative arthritis of hip 10/11/2015   Past Medical History:  Diagnosis Date  . Hyperlipidemia        Medications: Outpatient Medications Prior to Visit  Medication Sig  . levothyroxine (SYNTHROID) 50 MCG tablet Take 1 tablet (50 mcg total) by mouth daily. Please schedule office visit before any future refills.  . Multiple Vitamin tablet Take 1 tablet by mouth daily.   . Omega-3 Fatty Acids (FISH OIL MAXIMUM STRENGTH) 1200 MG CPDR Take 1,200 mg by mouth daily.    No facility-administered medications prior to visit.    Review of Systems  Constitutional: Negative for appetite change, chills, fatigue and fever.  Respiratory: Negative for chest tightness and shortness of breath.   Cardiovascular: Negative for chest pain and palpitations.  Gastrointestinal: Negative for abdominal pain, nausea and vomiting.  Genitourinary: Negative for difficulty urinating, dysuria, enuresis, flank pain, frequency, hematuria, pelvic pain and urgency.  Neurological: Negative for dizziness and weakness.     Last CBC Lab Results  Component Value Date   WBC 4.9 07/25/2018   HGB 12.4 07/25/2018   HCT 37.5 07/25/2018   MCV 87 07/25/2018   MCH 28.9 07/25/2018   RDW 12.8 07/25/2018   PLT 336 84/66/5993   Last metabolic panel Lab Results  Component Value Date   GLUCOSE 97 01/25/2020   NA 139 01/25/2020   K 4.1 01/25/2020   CL 102 01/25/2020   CO2 22 01/25/2020   BUN 10 01/25/2020   CREATININE 0.64 01/25/2020   GFRNONAA 89 01/25/2020   GFRAA 103 01/25/2020   CALCIUM 9.5 01/25/2020   PROT 7.1 01/25/2020   ALBUMIN 4.6 01/25/2020   LABGLOB 2.5 01/25/2020   AGRATIO 1.8 01/25/2020   BILITOT 0.5 01/25/2020   ALKPHOS 67 01/25/2020   AST 20 01/25/2020   ALT 13 01/25/2020   ANIONGAP 6 (L) 02/25/2013      Objective    BP 100/65 (BP Location: Left Arm, Patient Position: Sitting, Cuff Size: Normal)   Pulse 69   Temp 98 F (36.7 C) (Oral)   Resp 16   Wt 118 lb 9.6 oz (53.8 kg)   BMI 20.36 kg/m  BP Readings from Last 3 Encounters:  04/08/20 100/65  01/25/20 118/76  07/14/19 106/69   Wt Readings from Last 3 Encounters:  04/08/20 118 lb 9.6 oz (53.8 kg)  01/25/20 118 lb 9.6 oz (53.8 kg)  07/14/19 126 lb (57.2 kg)      Physical Exam Vitals reviewed.  Constitutional:      General: She is not  in acute distress.    Appearance: Normal appearance. She is well-developed and normal weight. She is not ill-appearing.  HENT:     Head: Normocephalic and atraumatic.  Pulmonary:     Effort: Pulmonary effort is normal. No respiratory distress.  Abdominal:     Hernia: There is no hernia in the left inguinal area or right inguinal area.  Genitourinary:    General: Normal vulva.     Exam position: Supine.     Pubic Area: No rash.      Labia:        Right: No rash, tenderness, lesion or injury.        Left: No rash, tenderness, lesion or injury.      Urethra: Urethral lesion (erythematous tissue around the urethra meatus between 8-12 o'clock position; possible prolapse) present.      Comments: Vaginal atrophy present Musculoskeletal:     Cervical back: Normal range of motion and neck supple.  Lymphadenopathy:     Lower Body: No right inguinal adenopathy. No left inguinal adenopathy.  Neurological:     Mental Status: She is alert.  Psychiatric:        Mood and Affect: Mood normal.        Behavior: Behavior normal.        Thought Content: Thought content normal.        Judgment: Judgment normal.       No results found for any visits on 04/08/20.  Assessment & Plan     1. Urethral lesion Lesion vs early prolapse. Patient is postmenopausal with signs of vaginal atrophy present. Advised to do Sitz baths if having any pain or discomfort. Will refer to [place] (female provider preferred) for further evaluation and treatment options.  - Ambulatory referral to Urology   No follow-ups on file.      Reynolds Bowl, PA-C, have reviewed all documentation for this visit. The documentation on 04/08/20 for the exam, diagnosis, procedures, and orders are all accurate and complete.   Rubye Beach  [facility] [phone] (phone) [phone] (fax)  [place]

## 2020-04-08 ENCOUNTER — Ambulatory Visit (INDEPENDENT_AMBULATORY_CARE_PROVIDER_SITE_OTHER): Payer: Medicare PPO | Admitting: Physician Assistant

## 2020-04-08 ENCOUNTER — Encounter: Payer: Self-pay | Admitting: Physician Assistant

## 2020-04-08 ENCOUNTER — Other Ambulatory Visit: Payer: Self-pay

## 2020-04-08 VITALS — BP 100/65 | HR 69 | Temp 98.0°F | Resp 16 | Wt 118.6 lb

## 2020-04-08 DIAGNOSIS — N369 Urethral disorder, unspecified: Secondary | ICD-10-CM

## 2020-04-14 ENCOUNTER — Other Ambulatory Visit: Payer: Self-pay

## 2020-04-14 ENCOUNTER — Encounter: Payer: Self-pay | Admitting: Urology

## 2020-04-14 ENCOUNTER — Ambulatory Visit: Payer: Medicare PPO | Admitting: Urology

## 2020-04-14 VITALS — BP 105/69 | HR 69 | Ht 64.0 in | Wt 118.0 lb

## 2020-04-14 DIAGNOSIS — N369 Urethral disorder, unspecified: Secondary | ICD-10-CM

## 2020-04-14 DIAGNOSIS — N362 Urethral caruncle: Secondary | ICD-10-CM

## 2020-04-14 NOTE — Progress Notes (Signed)
   04/14/20 12:49 PM   Janet Craig 01-19-1946 [id number]  CC: Urethral lesion  HPI: I saw Janet Craig in urology clinic today for evaluation of a urethral lesion.  She is a very healthy 74 year old female who 1 week ago noticed some discomfort at her urethra, and on exam noticed some redness at the 6 o'clock position of the urethra.  She has had no further pain since that time.  She denies any dysuria, gross hematuria, or other urinary symptoms.  She denies any incontinence or pain with sexual activity.  She denies any smoking history.  She has never had anything similar to this before.  Urinalysis today is benign with 0 RBCs, 0 WBCs, no bacteria.   PMH: Past Medical History:  Diagnosis Date  . Hyperlipidemia     Surgical History: Past Surgical History:  Procedure Laterality Date  . ABDOMINAL HYSTERECTOMY    . APPENDECTOMY    . COLONOSCOPY  12/2005  . EYE SURGERY    . LUMBAR MICRODISCECTOMY  11/2010  . TOTAL HIP ARTHROPLASTY Left 02/24/13    Family History: Family History  Problem Relation Age of Onset  . Breast cancer Mother 48  . Lung cancer Mother   . CVA Father   . Lung cancer Father   . Melanoma Father   . Multiple sclerosis Sister   . Diabetes Sister   . Atrial fibrillation Sister   . Lupus Brother   . Colon cancer Paternal Aunt   . Lung cancer Maternal Grandfather     Social History:  reports that she has never smoked. She has never used smokeless tobacco. She reports that she does not drink alcohol and does not use drugs.  Physical Exam: BP 105/69   Pulse 69   Ht 5\' 4"  (1.626 m)   Wt 118 lb (53.5 kg)   BMI 20.25 kg/m    Constitutional:  Alert and oriented, No acute distress. Cardiovascular: No clubbing, cyanosis, or edema. Respiratory: Normal respiratory effort, no increased work of breathing. GI: Abdomen is soft, nontender, nondistended, no abdominal masses GU: Pelvic exam performed with Janet Craig as chaperone.  There is erythema and small mass at the  6 o'clock position of the urethra consistent with a urethral caruncle, no other tumors or masses, nontender.  No purulent drainage  Assessment & Plan:   In summary, she is a healthy 74 year old female who presents with a urethral lesion, that on physical exam is most consistent with a urethral caruncle.  We discussed management of this at length.  We also discussed the low, but nonzero, risk of a malignancy and the importance of close follow-up to confirm resolution with Premarin cream.  Premarin cream every other day x 4-6 weeks for urethral caruncle, sitz baths encouraged RTC 6 weeks repeat pelvic exam  Nickolas Madrid, MD 04/14/2020  [facility] [address], [place], [place] [address] [phone]

## 2020-04-14 NOTE — Patient Instructions (Signed)
Conjugated Estrogens vaginal cream What is this medicine? CONJUGATED ESTROGENS (CON ju gate ed ESS troe jenz) are a mixture of female hormones. This cream can help relieve symptoms associated with menopause.like vaginal dryness and irritation. This medicine may be used for other purposes; ask your health care provider or pharmacist if you have questions. COMMON BRAND NAME(S): Premarin What should I tell my health care provider before I take this medicine? They need to know if you have any of these conditions:  abnormal vaginal bleeding  blood vessel disease or blood clots  breast, cervical, endometrial, or uterine cancer  dementia  diabetes  gallbladder disease  heart disease or recent heart attack  high blood pressure  high cholesterol  high level of calcium in the blood  hysterectomy  kidney disease  liver disease  migraine headaches  protein C deficiency  protein S deficiency  stroke  systemic lupus erythematosus (SLE)  tobacco smoker  an unusual or allergic reaction to estrogens other medicines, foods, dyes, or preservatives  pregnant or trying to get pregnant  breast-feeding How should I use this medicine? This medicine is for use in the vagina only. Do not take by mouth. Follow the directions on the prescription label. Use at bedtime unless otherwise directed by your doctor or health care professional.Wash hands before and after use. Apply a pea sized amount to the area every other day for 4-6 weeks. Use exactly as directed for the complete length of time prescribed. Do not stop using except on the advice of your doctor or health care professional. Talk to your pediatrician regarding the use of this medicine in children. Special care may be needed. A patient package insert for the product will be given with each prescription and refill. Read this sheet carefully each time. The sheet may change frequently. Overdosage: If you think you have taken too much of  this medicine contact a poison control center or emergency room at once. NOTE: This medicine is only for you. Do not share this medicine with others. What if I miss a dose? If you miss a dose, use it as soon as you can. If it is almost time for your next dose, use only that dose. Do not use double or extra doses. What may interact with this medicine? Do not take this medicine with any of the following medications:  aromatase inhibitors like aminoglutethimide, anastrozole, exemestane, letrozole, testolactone This medicine may also interact with the following medications:  barbiturates used for inducing sleep or treating seizures  carbamazepine  grapefruit juice  medicines for fungal infections like itraconazole and ketoconazole  raloxifene or tamoxifen  rifabutin  rifampin  rifapentine  ritonavir  some antibiotics used to treat infections  St. John's Wort  warfarin This list may not describe all possible interactions. Give your health care provider a list of all the medicines, herbs, non-prescription drugs, or dietary supplements you use. Also tell them if you smoke, drink alcohol, or use illegal drugs. Some items may interact with your medicine. What should I watch for while using this medicine? Visit your health care professional for regular checks on your progress. You will need a regular breast and pelvic exam. You should also discuss the need for regular mammograms with your health care professional, and follow his or her guidelines. This medicine can make your body retain fluid, making your fingers, hands, or ankles swell. Your blood pressure can go up. Contact your doctor or health care professional if you feel you are retaining fluid. If you  have any reason to think you are pregnant; stop taking this medicine at once and contact your doctor or health care professional. Tobacco smoking increases the risk of getting a blood clot or having a stroke, especially if you are  more than 73 years old. You are strongly advised not to smoke. If you wear contact lenses and notice visual changes, or if the lenses begin to feel uncomfortable, consult your eye care specialist. If you are going to have elective surgery, you may need to stop taking this medicine beforehand. Consult your health care professional for advice prior to scheduling the surgery. What side effects may I notice from receiving this medicine? Side effects that you should report to your doctor or health care professional as soon as possible:  allergic reactions like skin rash, itching or hives, swelling of the face, lips, or tongue  breast tissue changes or discharge  changes in vision  chest pain  confusion, trouble speaking or understanding  dark urine  general ill feeling or flu-like symptoms  light-colored stools  nausea, vomiting  pain, swelling, warmth in the leg  right upper belly pain  severe headaches  shortness of breath  sudden numbness or weakness of the face, arm or leg  trouble walking, dizziness, loss of balance or coordination  unusual vaginal bleeding  yellowing of the eyes or skin Side effects that usually do not require medical attention (report to your doctor or health care professional if they continue or are bothersome):  hair loss  increased hunger or thirst  increased urination  symptoms of vaginal infection like itching, irritation or unusual discharge  unusually weak or tired This list may not describe all possible side effects. Call your doctor for medical advice about side effects. You may report side effects to FDA at 1-800-FDA-1088. Where should I keep my medicine? Keep out of the reach of children. Store at room temperature between 15 and 30 degrees C (59 and 86 degrees F). Throw away any unused medicine after the expiration date. NOTE: This sheet is a summary. It may not cover all possible information. If you have questions about this  medicine, talk to your doctor, pharmacist, or health care provider.  2020 Elsevier/Gold Standard (2010-11-29 09:20:36)

## 2020-04-15 LAB — MICROSCOPIC EXAMINATION: Bacteria, UA: NONE SEEN

## 2020-04-15 LAB — URINALYSIS, COMPLETE
Bilirubin, UA: NEGATIVE
Glucose, UA: NEGATIVE
Ketones, UA: NEGATIVE
Leukocytes,UA: NEGATIVE
Nitrite, UA: NEGATIVE
Protein,UA: NEGATIVE
RBC, UA: NEGATIVE
Specific Gravity, UA: 1.025 (ref 1.005–1.030)
Urobilinogen, Ur: 0.2 mg/dL (ref 0.2–1.0)
pH, UA: 6.5 (ref 5.0–7.5)

## 2020-05-10 ENCOUNTER — Ambulatory Visit: Payer: Medicare PPO | Admitting: Family Medicine

## 2020-05-10 ENCOUNTER — Other Ambulatory Visit: Payer: Self-pay

## 2020-05-10 ENCOUNTER — Encounter: Payer: Self-pay | Admitting: Family Medicine

## 2020-05-10 VITALS — BP 104/60 | HR 70 | Temp 98.3°F | Resp 15 | Wt 120.0 lb

## 2020-05-10 DIAGNOSIS — N362 Urethral caruncle: Secondary | ICD-10-CM

## 2020-05-10 MED ORDER — ESTROGENS, CONJUGATED 0.625 MG/GM VA CREA: TOPICAL_CREAM | VAGINAL | 1 refills | Status: DC

## 2020-05-10 NOTE — Progress Notes (Signed)
Established patient visit   Patient: Janet Craig   DOB: Sep 03, 1946   74 y.o. Female  MRN: [id number] Visit Date: 05/10/2020  Today's healthcare provider: Lavon Paganini, MD  Cameron Ali as a scribe for Lavon Paganini, MD.,have documented all relevant documentation on the behalf of Lavon Paganini, MD,as directed by  Lavon Paganini, MD while in the presence of Lavon Paganini, MD.   Chief Complaint  Patient presents with  . Follow-up   Subjective    HPI  Follow up for urethral lesion  The patient was last seen for this 1 months ago. Changes made at last visit include none patient was advised to do sitz baths if having any pain or discomfort. Order was placed for Urology, patient states that she did see Urologist and was prescribed Premarin to take for two weeks but states that it did not help with symptoms She feels that condition is unchanged. Deneis any dysuria, urinary frequency, urinary urgency, hematuria.  Also denies fever, vaginal pain, vaginal discharge, vaginal bleeding.  She is worried that this may not be a urologic issue and maybe she needs gynecologic care.  She would also feel more comfortable seeing a female urologist or specialist if needed.  -----------------------------------------------------------------------------------------   Patient Active Problem List   Diagnosis Date Noted  . Annual physical exam 01/25/2020  . Osteopenia 04/02/2018  . Glaucoma 10/11/2015  . Prediabetes 10/11/2015  . HLD (hyperlipidemia) 10/11/2015  . Adult hypothyroidism 10/11/2015  . Cannot sleep 10/11/2015  . Degenerative arthritis of hip 10/11/2015   Past Medical History:  Diagnosis Date  . Hyperlipidemia    Social History   Tobacco Use  . Smoking status: Never Smoker  . Smokeless tobacco: Never Used  Vaping Use  . Vaping Use: Never used  Substance Use Topics  . Alcohol use: No  . Drug use: No   Allergies  Allergen Reactions  .  Neomycin-Bacitracin Zn-Polymyx   . Penicillins   . Miconazole Nitrate Rash       Medications: Outpatient Medications Prior to Visit  Medication Sig  . levothyroxine (SYNTHROID) 50 MCG tablet Take 1 tablet (50 mcg total) by mouth daily. Please schedule office visit before any future refills.  . Multiple Vitamin tablet Take 1 tablet by mouth daily.   . Omega-3 Fatty Acids (FISH OIL MAXIMUM STRENGTH) 1200 MG CPDR Take 1,200 mg by mouth daily.    No facility-administered medications prior to visit.    Review of Systems  Constitutional: Negative.   HENT: Negative.   Respiratory: Negative.   Cardiovascular: Negative.   Genitourinary: Negative for decreased urine volume, dysuria, enuresis, flank pain, frequency, genital sores, hematuria, menstrual problem, pelvic pain, urgency, vaginal bleeding, vaginal discharge and vaginal pain.  Musculoskeletal: Negative.       Objective    BP 104/60   Pulse 70   Temp 98.3 F (36.8 C) (Oral)   Resp 15   Wt 120 lb (54.4 kg)   SpO2 97%   BMI 20.60 kg/m    Physical Exam Vitals reviewed. Exam conducted with a chaperone present.  Constitutional:      General: She is not in acute distress.    Appearance: Normal appearance. She is not diaphoretic.  Pulmonary:     Effort: Pulmonary effort is normal. No respiratory distress.  Abdominal:     General: There is no distension.     Palpations: Abdomen is soft.     Tenderness: There is no abdominal tenderness.  Genitourinary:    Urethra:  Urethral lesion present.     Comments: Erythema of the urethra with small nodule at 6 o'clock position.  No other masses or tumors palpable.  No vaginal erythema or vulvar lesions.  No purulent discharge Musculoskeletal:     Right lower leg: No edema.     Left lower leg: No edema.  Skin:    Findings: No rash.  Neurological:     Mental Status: She is alert and oriented to person, place, and time. Mental status is at baseline.  Psychiatric:        Mood and  Affect: Mood normal.        Behavior: Behavior normal.     No results found for any visits on 05/10/20.  Assessment & Plan     1. Urethral caruncle -Noted about 1 month ago -She has been evaluated by urology and was advised that this is likely a urethral caruncle, but if it was not improving at the 6-week point, further work-up may be necessary -She has run out of Premarin cream, so I will refill her prescription, but she was using samples and it is possible this will be expensive, so she will call the urology office to request further samples as we do not have any here -Advised her to continue with that follow-up with urology in 2 weeks as she was previously scheduled -Did place new referral for her to possibly be seen by a female urologist as this is her preference for comfort -Did reassure her that this is a urologic problem and not a gynecologic problem at this time - Ambulatory referral to Urology   Return if symptoms worsen or fail to improve.      I, Lavon Paganini, MD, have reviewed all documentation for this visit. The documentation on 05/10/20 for the exam, diagnosis, procedures, and orders are all accurate and complete.   Jaslynn Thome, Dionne Bucy, MD, MPH [facility]

## 2020-05-10 NOTE — Patient Instructions (Signed)
Conjugated Estrogens vaginal cream What is this medicine? CONJUGATED ESTROGENS (CON ju gate ed ESS troe jenz) are a mixture of female hormones. This cream can help relieve symptoms associated with menopause.like vaginal dryness and irritation. This medicine may be used for other purposes; ask your health care provider or pharmacist if you have questions. COMMON BRAND NAME(S): Premarin What should I tell my health care provider before I take this medicine? They need to know if you have any of these conditions:  abnormal vaginal bleeding  blood vessel disease or blood clots  breast, cervical, endometrial, or uterine cancer  dementia  diabetes  gallbladder disease  heart disease or recent heart attack  high blood pressure  high cholesterol  high level of calcium in the blood  hysterectomy  kidney disease  liver disease  migraine headaches  protein C deficiency  protein S deficiency  stroke  systemic lupus erythematosus (SLE)  tobacco smoker  an unusual or allergic reaction to estrogens other medicines, foods, dyes, or preservatives  pregnant or trying to get pregnant  breast-feeding How should I use this medicine? This medicine is for use in the vagina only. Do not take by mouth. Follow the directions on the prescription label. Use at bedtime unless otherwise directed by your doctor or health care professional. Use the special applicator supplied with the cream. Wash hands before and after use. Fill the applicator with the cream and remove from the tube. Lie on your back, part and bend your knees. Insert the applicator into the vagina and push the plunger to expel the cream into the vagina. Wash the applicator with warm soapy water and rinse well. Use exactly as directed for the complete length of time prescribed. Do not stop using except on the advice of your doctor or health care professional. Talk to your pediatrician regarding the use of this medicine in  children. Special care may be needed. A patient package insert for the product will be given with each prescription and refill. Read this sheet carefully each time. The sheet may change frequently. Overdosage: If you think you have taken too much of this medicine contact a poison control center or emergency room at once. NOTE: This medicine is only for you. Do not share this medicine with others. What if I miss a dose? If you miss a dose, use it as soon as you can. If it is almost time for your next dose, use only that dose. Do not use double or extra doses. What may interact with this medicine? Do not take this medicine with any of the following medications:  aromatase inhibitors like aminoglutethimide, anastrozole, exemestane, letrozole, testolactone This medicine may also interact with the following medications:  barbiturates used for inducing sleep or treating seizures  carbamazepine  grapefruit juice  medicines for fungal infections like itraconazole and ketoconazole  raloxifene or tamoxifen  rifabutin  rifampin  rifapentine  ritonavir  some antibiotics used to treat infections  St. John's Wort  warfarin This list may not describe all possible interactions. Give your health care provider a list of all the medicines, herbs, non-prescription drugs, or dietary supplements you use. Also tell them if you smoke, drink alcohol, or use illegal drugs. Some items may interact with your medicine. What should I watch for while using this medicine? Visit your health care professional for regular checks on your progress. You will need a regular breast and pelvic exam. You should also discuss the need for regular mammograms with your health care professional, and  follow his or her guidelines. °This medicine can make your body retain fluid, making your fingers, hands, or ankles swell. Your blood pressure can go up. Contact your doctor or health care professional if you feel you are  retaining fluid. °If you have any reason to think you are pregnant; stop taking this medicine at once and contact your doctor or health care professional. °Tobacco smoking increases the risk of getting a blood clot or having a stroke, especially if you are more than 73 years old. You are strongly advised not to smoke. °If you wear contact lenses and notice visual changes, or if the lenses begin to feel uncomfortable, consult your eye care specialist. °If you are going to have elective surgery, you may need to stop taking this medicine beforehand. Consult your health care professional for advice prior to scheduling the surgery. °What side effects may I notice from receiving this medicine? °Side effects that you should report to your doctor or health care professional as soon as possible: °· allergic reactions like skin rash, itching or hives, swelling of the face, lips, or tongue °· breast tissue changes or discharge °· changes in vision °· chest pain °· confusion, trouble speaking or understanding °· dark urine °· general ill feeling or flu-like symptoms °· light-colored stools °· nausea, vomiting °· pain, swelling, warmth in the leg °· right upper belly pain °· severe headaches °· shortness of breath °· sudden numbness or weakness of the face, arm or leg °· trouble walking, dizziness, loss of balance or coordination °· unusual vaginal bleeding °· yellowing of the eyes or skin °Side effects that usually do not require medical attention (report to your doctor or health care professional if they continue or are bothersome): °· hair loss °· increased hunger or thirst °· increased urination °· symptoms of vaginal infection like itching, irritation or unusual discharge °· unusually weak or tired °This list may not describe all possible side effects. Call your doctor for medical advice about side effects. You may report side effects to FDA at 1-800-FDA-1088. °Where should I keep my medicine? °Keep out of the reach of  children. °Store at room temperature between 15 and 30 degrees C (59 and 86 degrees F). Throw away any unused medicine after the expiration date. °NOTE: This sheet is a summary. It may not cover all possible information. If you have questions about this medicine, talk to your doctor, pharmacist, or health care provider. °© 2020 Elsevier/Gold Standard (2010-11-29 09:20:36) ° °

## 2020-05-17 NOTE — Progress Notes (Signed)
05/18/2020 10:02 AM   Janet Craig 1946-01-17 [id number]  Referring provider: Virginia Crews, [place],  [place] [address] Chief Complaint  Patient presents with  . Follow-up    HPI: Janet Craig is a 74 y.o. female who returns for a 1 month follow up of urethral caruncle.  The patient was last seen in clinic on 04/14/2020 with Dr. Diamantina Providence. She reported urethra discomfort x 1 week. She had redness at the 6 o'clock position of the urethra. She denied any further pain. She denied any dysuria, gross hematuria, or other urinary symptoms. She denied any incontinence or pain with sexual activity.   Physical exam was consistent with urethral caruncle. Patient was placed on premarin cream every other day x 4-6 weeks. She was encouraged too take sitz baths. Dr. Diamantina Providence recommended a repeat pelvic exam. UA was negative.   She reports using the topical estrogen cream. Urethral discomfort has not changed. She continues to burn and notes urethral caruncle erythema. Denies itching and bleeding. Denies having any bothersome symptoms.   Never smoker.    PMH: Past Medical History:  Diagnosis Date  . Hyperlipidemia     Surgical History: Past Surgical History:  Procedure Laterality Date  . ABDOMINAL HYSTERECTOMY    . APPENDECTOMY    . COLONOSCOPY  12/2005  . EYE SURGERY    . LUMBAR MICRODISCECTOMY  11/2010  . TOTAL HIP ARTHROPLASTY Left 02/24/13    Home Medications:  Allergies as of 05/18/2020      Reactions   Neomycin-bacitracin Zn-polymyx    Penicillins    Miconazole Nitrate Rash      Medication List       Accurate as of May 18, 2020 10:02 AM. If you have any questions, ask your nurse or doctor.        conjugated estrogens vaginal cream Commonly known as: PREMARIN Place 0.5 grams topically to affected area every other day   Fish Oil Maximum Strength 1200 MG Cpdr Take 1,200 mg by mouth daily.   levothyroxine 50 MCG  tablet Commonly known as: SYNTHROID Take 1 tablet (50 mcg total) by mouth daily. Please schedule office visit before any future refills.   Multiple Vitamin tablet Take 1 tablet by mouth daily.       Allergies:  Allergies  Allergen Reactions  . Neomycin-Bacitracin Zn-Polymyx   . Penicillins   . Miconazole Nitrate Rash    Family History: Family History  Problem Relation Age of Onset  . Breast cancer Mother 53  . Lung cancer Mother   . CVA Father   . Lung cancer Father   . Melanoma Father   . Multiple sclerosis Sister   . Diabetes Sister   . Atrial fibrillation Sister   . Lupus Brother   . Colon cancer Paternal Aunt   . Lung cancer Maternal Grandfather     Social History:  reports that she has never smoked. She has never used smokeless tobacco. She reports that she does not drink alcohol and does not use drugs.   Physical Exam: BP 106/66   Pulse 77   Constitutional:  Alert and oriented, No acute distress. HEENT:  AT, moist mucus membranes.  Trachea midline, no masses. Cardiovascular: No clubbing, cyanosis, or edema. Respiratory: Normal respiratory effort, no increased work of breathing. Skin: No rashes, bruises or suspicious lesions. Neurologic: Grossly intact, no focal deficits, moving all 4 extremities. Psychiatric: Normal mood and affect.  Laboratory Data:  Lab Results  Component Value  Date   CREATININE 0.64 01/25/2020    Lab Results  Component Value Date   HGBA1C 6.0 (H) 01/25/2020     Assessment & Plan:    1. Urethral caruncle  Symptoms are stable/ mildly improving We discussed continuation of Premarin cream- give it more time to take effect Alternative including surgical excision discussed, she decline but will return if it fails to resolve Patient agreed.   [facility] [address] [place], [place] [address] [phone]  I, Selena Batten, am acting as a scribe for Dr. Hollice Espy.  I have  reviewed the above documentation for accuracy and completeness, and I agree with the above.   Hollice Espy, MD

## 2020-05-18 ENCOUNTER — Other Ambulatory Visit: Payer: Self-pay

## 2020-05-18 ENCOUNTER — Ambulatory Visit: Payer: Medicare PPO | Admitting: Urology

## 2020-05-18 VITALS — BP 106/66 | HR 77

## 2020-05-18 DIAGNOSIS — N362 Urethral caruncle: Secondary | ICD-10-CM

## 2020-05-18 MED ORDER — ESTRADIOL 0.1 MG/GM VA CREA: TOPICAL_CREAM | VAGINAL | 12 refills | Status: DC

## 2020-05-18 NOTE — Patient Instructions (Signed)
A urethral caruncle is a benign outgrowth at the urethral meatus (urethral opening). They occur most commonly in postmenopausal women. Urethral caruncles occur when the outermost part of the urethra everts or turns out. When the mucosa is circumferentially everted, meaning the growth encompasses the entire diameter of the urethra, rather than just a segment of the urethra, the lesion is a urethral prolapse.  Urethral caruncles are often asymptomatic and found on pelvic exam.  Symptoms may include pain, light bleeding, painful urination, or impeded flow of urine. Women may also notice a bump at the urethral meatus.  In asymptomatic patients, treatment is often not necessary. For symptomatic patients, treatment includes vaginal estrogen cream, anti-inflammatory medications such as Motrin, and warm sitz baths.  Surgical excision is recommended for larger, symptomatic caruncles that do not respond to more conservative treatment.  

## 2020-06-02 ENCOUNTER — Ambulatory Visit: Payer: Self-pay | Admitting: Urology

## 2020-07-22 DIAGNOSIS — S82002A Unspecified fracture of left patella, initial encounter for closed fracture: Secondary | ICD-10-CM | POA: Diagnosis not present

## 2020-07-22 DIAGNOSIS — M25462 Effusion, left knee: Secondary | ICD-10-CM | POA: Diagnosis not present

## 2020-07-22 DIAGNOSIS — S82032A Displaced transverse fracture of left patella, initial encounter for closed fracture: Secondary | ICD-10-CM | POA: Diagnosis not present

## 2020-07-22 DIAGNOSIS — S82042A Displaced comminuted fracture of left patella, initial encounter for closed fracture: Secondary | ICD-10-CM | POA: Diagnosis not present

## 2020-07-22 DIAGNOSIS — S8992XA Unspecified injury of left lower leg, initial encounter: Secondary | ICD-10-CM | POA: Diagnosis not present

## 2020-07-27 ENCOUNTER — Ambulatory Visit: Payer: Self-pay | Admitting: Family Medicine

## 2020-07-29 DIAGNOSIS — M25562 Pain in left knee: Secondary | ICD-10-CM | POA: Diagnosis not present

## 2020-07-29 DIAGNOSIS — S82002A Unspecified fracture of left patella, initial encounter for closed fracture: Secondary | ICD-10-CM | POA: Diagnosis not present

## 2020-08-11 DIAGNOSIS — S82002D Unspecified fracture of left patella, subsequent encounter for closed fracture with routine healing: Secondary | ICD-10-CM | POA: Diagnosis not present

## 2020-09-01 DIAGNOSIS — S82002D Unspecified fracture of left patella, subsequent encounter for closed fracture with routine healing: Secondary | ICD-10-CM | POA: Diagnosis not present

## 2020-09-01 DIAGNOSIS — M25562 Pain in left knee: Secondary | ICD-10-CM | POA: Diagnosis not present

## 2020-09-19 ENCOUNTER — Encounter: Payer: Self-pay | Admitting: Family Medicine

## 2020-09-19 ENCOUNTER — Other Ambulatory Visit: Payer: Self-pay

## 2020-09-19 ENCOUNTER — Ambulatory Visit: Payer: Medicare PPO | Admitting: Family Medicine

## 2020-09-19 VITALS — BP 112/62 | HR 68 | Temp 97.8°F | Resp 16 | Ht 64.0 in | Wt 126.4 lb

## 2020-09-19 DIAGNOSIS — N362 Urethral caruncle: Secondary | ICD-10-CM | POA: Diagnosis not present

## 2020-09-19 DIAGNOSIS — E039 Hypothyroidism, unspecified: Secondary | ICD-10-CM | POA: Diagnosis not present

## 2020-09-19 DIAGNOSIS — E785 Hyperlipidemia, unspecified: Secondary | ICD-10-CM

## 2020-09-19 DIAGNOSIS — R7303 Prediabetes: Secondary | ICD-10-CM

## 2020-09-19 NOTE — Assessment & Plan Note (Signed)
Followed by [facility] it is still present Not bothersome Continue premarin If becomes bothersome, urology did discuss surgical excision if needed

## 2020-09-19 NOTE — Assessment & Plan Note (Signed)
Reviewed last lipid panel Not currently on a statin Recheck FLP and CMP Discussed diet and exercise  

## 2020-09-19 NOTE — Assessment & Plan Note (Signed)
Encourage low carb diet Recheck A1c 

## 2020-09-19 NOTE — Assessment & Plan Note (Signed)
Previously well controlled Continue Synthroid at current dose  Recheck TSH and adjust Synthroid as indicated   

## 2020-09-19 NOTE — Patient Instructions (Signed)
Hypothyroidism  Hypothyroidism is when the thyroid gland does not make enough of certain hormones (it is underactive). The thyroid gland is a small gland located in the lower front part of the neck, just in front of the windpipe (trachea). This gland makes hormones that help control how the body uses food for energy (metabolism) as well as how the heart and brain function. These hormones also play a role in keeping your bones strong. When the thyroid is underactive, it produces too little of the hormones thyroxine (T4) and triiodothyronine (T3). What are the causes? This condition may be caused by:  Hashimoto's disease. This is a disease in which the body's disease-fighting system (immune system) attacks the thyroid gland. This is the most common cause.  Viral infections.  Pregnancy.  Certain medicines.  Birth defects.  Past radiation treatments to the head or neck for cancer.  Past treatment with radioactive iodine.  Past exposure to radiation in the environment.  Past surgical removal of part or all of the thyroid.  Problems with a gland in the center of the brain (pituitary gland).  Lack of enough iodine in the diet. What increases the risk? You are more likely to develop this condition if:  You are female.  You have a family history of thyroid conditions.  You use a medicine called lithium.  You take medicines that affect the immune system (immunosuppressants). What are the signs or symptoms? Symptoms of this condition include:  Feeling as though you have no energy (lethargy).  Not being able to tolerate cold.  Weight gain that is not explained by a change in diet or exercise habits.  Lack of appetite.  Dry skin.  Coarse hair.  Menstrual irregularity.  Slowing of thought processes.  Constipation.  Sadness or depression. How is this diagnosed? This condition may be diagnosed based on:  Your symptoms, your medical history, and a physical exam.  Blood  tests. You may also have imaging tests, such as an ultrasound or MRI. How is this treated? This condition is treated with medicine that replaces the thyroid hormones that your body does not make. After you begin treatment, it may take several weeks for symptoms to go away. Follow these instructions at home:  Take over-the-counter and prescription medicines only as told by your health care provider.  If you start taking any new medicines, tell your health care provider.  Keep all follow-up visits as told by your health care provider. This is important. ? As your condition improves, your dosage of thyroid hormone medicine may change. ? You will need to have blood tests regularly so that your health care provider can monitor your condition. Contact a health care provider if:  Your symptoms do not get better with treatment.  You are taking thyroid hormone replacement medicine and you: ? Sweat a lot. ? Have tremors. ? Feel anxious. ? Lose weight rapidly. ? Cannot tolerate heat. ? Have emotional swings. ? Have diarrhea. ? Feel weak. Get help right away if you have:  Chest pain.  An irregular heartbeat.  A rapid heartbeat.  Difficulty breathing. Summary  Hypothyroidism is when the thyroid gland does not make enough of certain hormones (it is underactive).  When the thyroid is underactive, it produces too little of the hormones thyroxine (T4) and triiodothyronine (T3).  The most common cause is Hashimoto's disease, a disease in which the body's disease-fighting system (immune system) attacks the thyroid gland. The condition can also be caused by viral infections, medicine, pregnancy, or   past radiation treatment to the head or neck.  Symptoms may include weight gain, dry skin, constipation, feeling as though you do not have energy, and not being able to tolerate cold.  This condition is treated with medicine to replace the thyroid hormones that your body does not make. This  information is not intended to replace advice given to you by your health care provider. Make sure you discuss any questions you have with your health care provider. Document Revised: 05/27/2020 Document Reviewed: 05/12/2020 Elsevier Patient Education  2021 Elsevier Inc.  

## 2020-09-19 NOTE — Progress Notes (Signed)
Established patient visit   Patient: Janet Craig   DOB: 1945/09/15   75 y.o. Female  MRN: [id number] Visit Date: 09/19/2020  Today's healthcare provider: Lavon Paganini, MD   Chief Complaint  Patient presents with  . Hypothyroidism   Subjective    HPI  Hypothyroid, follow-up  Lab Results  Component Value Date   TSH 2.440 01/25/2020   TSH 0.685 10/03/2018   TSH 7.510 (H) 07/25/2018   T4TOTAL 5.4 04/19/2017   Wt Readings from Last 3 Encounters:  09/19/20 126 lb 6.4 oz (57.3 kg)  05/10/20 120 lb (54.4 kg)  04/14/20 118 lb (53.5 kg)    She was last seen for hypothyroid 6 months ago.  Management since that visit includes no changes. She reports excellent compliance with treatment. She is not having side effects.   Symptoms: No change in energy level No constipation  No diarrhea No heat / cold intolerance  No nervousness No palpitations  No weight changes    -----------------------------------------------------------------------------------------   Patient Active Problem List   Diagnosis Date Noted  . Urethral caruncle 09/19/2020  . Annual physical exam 01/25/2020  . Osteopenia 04/02/2018  . Glaucoma 10/11/2015  . Prediabetes 10/11/2015  . HLD (hyperlipidemia) 10/11/2015  . Adult hypothyroidism 10/11/2015  . Cannot sleep 10/11/2015  . Degenerative arthritis of hip 10/11/2015   Social History   Tobacco Use  . Smoking status: Never Smoker  . Smokeless tobacco: Never Used  Vaping Use  . Vaping Use: Never used  Substance Use Topics  . Alcohol use: No  . Drug use: No   Allergies  Allergen Reactions  . Neomycin-Bacitracin Zn-Polymyx   . Penicillins   . Miconazole Nitrate Rash       Medications: Outpatient Medications Prior to Visit  Medication Sig  . levothyroxine (SYNTHROID) 50 MCG tablet Take 1 tablet (50 mcg total) by mouth daily. Please schedule office visit before any future refills.  . Multiple Vitamin tablet Take 1 tablet by  mouth daily.   . Omega-3 Fatty Acids (FISH OIL MAXIMUM STRENGTH) 1200 MG CPDR Take 1,200 mg by mouth daily.   . [DISCONTINUED] conjugated estrogens (PREMARIN) vaginal cream Place 0.5 grams topically to affected area every other day (Patient not taking: Reported on 09/19/2020)  . [DISCONTINUED] estradiol (ESTRACE) 0.1 MG/GM vaginal cream Use pea size amount to urethra Monday Wednesday Friday prior to bed (Patient not taking: Reported on 09/19/2020)   No facility-administered medications prior to visit.    Review of Systems  Constitutional: Negative for activity change, chills, fatigue and unexpected weight change.  Respiratory: Negative for cough and shortness of breath.   Cardiovascular: Negative for chest pain and palpitations.  Gastrointestinal: Negative for constipation and diarrhea.  Endocrine: Negative for cold intolerance and heat intolerance.  Skin: Negative for color change.    Last CBC Lab Results  Component Value Date   WBC 4.9 07/25/2018   HGB 12.4 07/25/2018   HCT 37.5 07/25/2018   MCV 87 07/25/2018   MCH 28.9 07/25/2018   RDW 12.8 07/25/2018   PLT 336 07/25/2018   Last thyroid functions Lab Results  Component Value Date   TSH 2.440 01/25/2020   T4TOTAL 5.4 04/19/2017      Objective    BP 112/62 (BP Location: Right Arm, Patient Position: Sitting, Cuff Size: Normal)   Pulse 68   Temp 97.8 F (36.6 C) (Oral)   Resp 16   Ht 5\' 4"  (1.626 m)   Wt 126 lb 6.4 oz (  57.3 kg)   SpO2 99%   BMI 21.70 kg/m  BP Readings from Last 3 Encounters:  09/19/20 112/62  05/18/20 106/66  05/10/20 104/60   Wt Readings from Last 3 Encounters:  09/19/20 126 lb 6.4 oz (57.3 kg)  05/10/20 120 lb (54.4 kg)  04/14/20 118 lb (53.5 kg)      Physical Exam Vitals reviewed.  Constitutional:      General: She is not in acute distress.    Appearance: Normal appearance. She is well-developed. She is not diaphoretic.  HENT:     Head: Normocephalic and atraumatic.  Eyes:      General: No scleral icterus.    Conjunctiva/sclera: Conjunctivae normal.  Neck:     Thyroid: No thyromegaly.  Cardiovascular:     Rate and Rhythm: Normal rate and regular rhythm.     Pulses: Normal pulses.     Heart sounds: Normal heart sounds. No murmur heard.   Pulmonary:     Effort: Pulmonary effort is normal. No respiratory distress.     Breath sounds: Normal breath sounds. No wheezing, rhonchi or rales.  Musculoskeletal:     Cervical back: Neck supple.     Right lower leg: No edema.     Left lower leg: No edema.  Lymphadenopathy:     Cervical: No cervical adenopathy.  Skin:    General: Skin is warm and dry.     Findings: No rash.  Neurological:     Mental Status: She is alert and oriented to person, place, and time. Mental status is at baseline.  Psychiatric:        Mood and Affect: Mood normal.        Behavior: Behavior normal.       No results found for any visits on 09/19/20.  Assessment & Plan     Problem List Items Addressed This Visit      Endocrine   Adult hypothyroidism - Primary    Previously well controlled Continue Synthroid at current dose  Recheck TSH and adjust Synthroid as indicated       Relevant Orders   TSH     Genitourinary   Urethral caruncle    Followed by [facility] it is still present Not bothersome Continue premarin If becomes bothersome, urology did discuss surgical excision if needed        Other   Prediabetes    Encourage low carb diet Recheck A1c      Relevant Orders   Hemoglobin A1c   HLD (hyperlipidemia)    Reviewed last lipid panel Not currently on a statin Recheck FLP and CMP Discussed diet and exercise      Relevant Orders   Comprehensive metabolic panel   Lipid panel       Return in about 6 months (around 03/19/2021) for AWV, CPE.      I, Lavon Paganini, MD, have reviewed all documentation for this visit. The documentation on 09/19/20 for the exam, diagnosis, procedures, and orders are all  accurate and complete.   Miyako Oelke, Dionne Bucy, MD, MPH [facility]

## 2020-09-20 LAB — COMPREHENSIVE METABOLIC PANEL
ALT: 9 IU/L (ref 0–32)
AST: 15 IU/L (ref 0–40)
Albumin/Globulin Ratio: 1.7 (ref 1.2–2.2)
Albumin: 4.3 g/dL (ref 3.7–4.7)
Alkaline Phosphatase: 72 IU/L (ref 44–121)
BUN/Creatinine Ratio: 18 (ref 12–28)
BUN: 11 mg/dL (ref 8–27)
Bilirubin Total: 0.4 mg/dL (ref 0.0–1.2)
CO2: 26 mmol/L (ref 20–29)
Calcium: 9.6 mg/dL (ref 8.7–10.3)
Chloride: 102 mmol/L (ref 96–106)
Creatinine, Ser: 0.61 mg/dL (ref 0.57–1.00)
GFR calc Af Amer: 104 mL/min/{1.73_m2} (ref >59)
GFR calc non Af Amer: 90 mL/min/{1.73_m2} (ref >59)
Globulin, Total: 2.6 g/dL (ref 1.5–4.5)
Glucose: 72 mg/dL (ref 65–99)
Potassium: 3.9 mmol/L (ref 3.5–5.2)
Sodium: 141 mmol/L (ref 134–144)
Total Protein: 6.9 g/dL (ref 6.0–8.5)

## 2020-09-20 LAB — LIPID PANEL
Chol/HDL Ratio: 3.7 ratio (ref 0.0–4.4)
Cholesterol, Total: 216 mg/dL — ABNORMAL HIGH (ref 100–199)
HDL: 59 mg/dL (ref >39)
LDL Chol Calc (NIH): 133 mg/dL — ABNORMAL HIGH (ref 0–99)
Triglycerides: 134 mg/dL (ref 0–149)
VLDL Cholesterol Cal: 24 mg/dL (ref 5–40)

## 2020-09-20 LAB — HEMOGLOBIN A1C
Est. average glucose Bld gHb Est-mCnc: 128 mg/dL
Hgb A1c MFr Bld: 6.1 % — ABNORMAL HIGH (ref 4.8–5.6)

## 2020-09-20 LAB — TSH: TSH: 10.2 u[IU]/mL — ABNORMAL HIGH (ref 0.450–4.500)

## 2020-09-21 ENCOUNTER — Telehealth: Payer: Self-pay

## 2020-09-21 DIAGNOSIS — E039 Hypothyroidism, unspecified: Secondary | ICD-10-CM

## 2020-09-21 MED ORDER — LEVOTHYROXINE SODIUM 75 MCG PO TABS: 75.0000 ug | ORAL_TABLET | Freq: Every day | ORAL | 1 refills | Status: DC

## 2020-09-21 NOTE — Telephone Encounter (Signed)
Patient advised as below. Patient refused to start atorvastatin at this time, reports she will continue to work on diet and exercise. Patient reports good compliance on levothyroxine. New prescription sent to [facility].

## 2020-09-21 NOTE — Telephone Encounter (Signed)
-----   Message from Virginia Crews, MD sent at 09/20/2020 10:42 AM EST ----- Normal/stable labs, except A1c remains in the prediabetic range.  Cholesterol is elevated, but slightly better than last year. The 10-year ASCVD (heart disease and stroke) risk score Mikey Bussing DC Jr., et al., 2013) is: 10.3%, which is high.  I think that she should consider cholesterol medicine to lower this risk.  If she is agreeable, consider atorvastatin 20 mg daily.  Also, recommend diet low in saturated fat and regular exercise - 30 min at least 5 times per week.  TSH is elevated, which means that her thyroid is not adequately replaced.  If she is taking her levothyroxine regularly with good compliance, we may need to increase the dose.  If taking regularly, we can increase her Synthroid to 75 mcg daily (ok to send new rx).  If she is not taking good compliance, would recommend improving this.

## 2020-09-23 DIAGNOSIS — S82002D Unspecified fracture of left patella, subsequent encounter for closed fracture with routine healing: Secondary | ICD-10-CM | POA: Diagnosis not present

## 2020-11-23 ENCOUNTER — Ambulatory Visit: Payer: Medicare PPO | Admitting: Family Medicine

## 2020-11-23 ENCOUNTER — Ambulatory Visit: Payer: Medicare PPO

## 2020-12-07 ENCOUNTER — Other Ambulatory Visit: Payer: Self-pay

## 2020-12-07 ENCOUNTER — Ambulatory Visit (INDEPENDENT_AMBULATORY_CARE_PROVIDER_SITE_OTHER): Payer: Medicare PPO

## 2020-12-07 DIAGNOSIS — Z23 Encounter for immunization: Secondary | ICD-10-CM | POA: Diagnosis not present

## 2021-02-13 ENCOUNTER — Encounter: Payer: Self-pay | Admitting: Family Medicine

## 2021-02-17 DIAGNOSIS — H35433 Paving stone degeneration of retina, bilateral: Secondary | ICD-10-CM | POA: Diagnosis not present

## 2021-04-06 ENCOUNTER — Ambulatory Visit (INDEPENDENT_AMBULATORY_CARE_PROVIDER_SITE_OTHER): Payer: Medicare PPO | Admitting: Family Medicine

## 2021-04-06 ENCOUNTER — Other Ambulatory Visit: Payer: Self-pay

## 2021-04-06 ENCOUNTER — Encounter: Payer: Self-pay | Admitting: Family Medicine

## 2021-04-06 VITALS — BP 93/66 | HR 67 | Temp 97.9°F | Resp 16 | Ht 64.5 in | Wt 114.9 lb

## 2021-04-06 DIAGNOSIS — Z0000 Encounter for general adult medical examination without abnormal findings: Secondary | ICD-10-CM | POA: Diagnosis not present

## 2021-04-06 DIAGNOSIS — E785 Hyperlipidemia, unspecified: Secondary | ICD-10-CM | POA: Diagnosis not present

## 2021-04-06 DIAGNOSIS — E039 Hypothyroidism, unspecified: Secondary | ICD-10-CM | POA: Diagnosis not present

## 2021-04-06 DIAGNOSIS — Z1231 Encounter for screening mammogram for malignant neoplasm of breast: Secondary | ICD-10-CM | POA: Diagnosis not present

## 2021-04-06 DIAGNOSIS — R7303 Prediabetes: Secondary | ICD-10-CM

## 2021-04-06 NOTE — Progress Notes (Signed)
Complete physical exam   Patient: Janet Craig   DOB: Nov 20, 1945   75 y.o. Female  MRN: [id number] Visit Date: 04/06/2021  Today's healthcare provider: Lavon Paganini, MD   Chief Complaint  Patient presents with   Annual Exam   Subjective    Janet Craig is a 75 y.o. female who presents today for a complete physical exam.  She reports consuming a  diet.  She generally feels well. She reports sleeping well. She does not have additional problems to discuss today.  HPI  She is taking her vitamins at night and she is also taking 75 mg of synthroid.    Vaccines She have received 3 COVID vaccines. She had not received her shingles vaccine.   Screening Her last mammogram was in 2020 and she is amendable for scheduling an appointment.  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  Tobacco Use   Smoking status: Never   Smokeless tobacco: Never  Vaping Use   Vaping Use: Never used  Substance and Sexual Activity   Alcohol use: No   Drug use: No   Sexual activity: Yes  Other Topics Concern   Not on file  Social History Narrative   Not on file   Social Determinants of Health   Financial Resource Strain: Not on file  Food Insecurity: Not on file  Transportation Needs: Not on file  Physical Activity: Not on file  Stress: Not on file  Social Connections: Not on file  Intimate Partner Violence: Not on file   Family Status  Relation  Name Status   Mother  Deceased   Father  Deceased at age 28   Sister  Alive   Brother  Alive   Brother  Alive   Field seismologist  (Not Specified)   MGF  (Not Specified)   Family History  Problem Relation Age of Onset   Breast cancer Mother 35   Lung cancer Mother    CVA Father    Lung cancer Father    Melanoma Father    Multiple sclerosis Sister    Diabetes Sister    Atrial fibrillation Sister    Lupus Brother    Colon cancer Paternal Aunt    Lung cancer Maternal Grandfather    Allergies  Allergen Reactions   Neomycin-Bacitracin Zn-Polymyx    Penicillins    Miconazole Nitrate Rash    Patient Care Team: Virginia Crews, MD as PCP - General (Family Medicine) Birder Robson, MD as Referring Physician (Ophthalmology) Jerrol Banana., MD (Family Medicine)   Medications: Outpatient Medications Prior to Visit  Medication Sig   levothyroxine (SYNTHROID) 75 MCG tablet Take 1 tablet (75 mcg total) by mouth daily.   Multiple Vitamin tablet Take 1 tablet by mouth daily.    Omega-3 Fatty Acids (FISH OIL MAXIMUM STRENGTH) 1200 MG CPDR Take 1,200  mg by mouth daily.    No facility-administered medications prior to visit.    Review of Systems  Constitutional: Negative.  Negative for chills, fatigue and fever.  HENT: Negative.  Negative for ear pain, sinus pressure, sinus pain and sore throat.   Eyes: Negative.  Negative for pain and visual disturbance.  Respiratory: Negative.  Negative for cough, chest tightness, shortness of breath and wheezing.   Cardiovascular: Negative.  Negative for chest pain, palpitations and leg swelling.  Gastrointestinal: Negative.  Negative for abdominal pain, blood in stool, constipation, diarrhea, nausea and vomiting.  Endocrine: Negative.   Genitourinary: Negative.  Negative for dysuria, flank pain, frequency, pelvic pain and urgency.  Musculoskeletal:  Negative for back pain, myalgias and neck pain.  Skin: Negative.   Allergic/Immunologic:  Negative.   Neurological: Negative.  Negative for dizziness, seizures, syncope, weakness, light-headedness, numbness and headaches.  Hematological: Negative.   Psychiatric/Behavioral: Negative.       Objective    BP 93/66   Pulse 67   Temp 97.9 F (36.6 C) (Oral)   Resp 16   Ht 5' 4.5" (1.638 m)   Wt 114 lb 14.4 oz (52.1 kg)   SpO2 97%   BMI 19.42 kg/m    Physical Exam Vitals reviewed.  Constitutional:      General: She is not in acute distress.    Appearance: Normal appearance. She is well-developed and normal weight. She is not diaphoretic.  HENT:     Head: Normocephalic and atraumatic.     Right Ear: Hearing, tympanic membrane, ear canal and external ear normal.     Left Ear: Hearing, tympanic membrane, ear canal and external ear normal.     Nose: Nose normal.     Mouth/Throat:     Mouth: Mucous membranes are moist.     Pharynx: Oropharynx is clear. No oropharyngeal exudate.  Eyes:     General: No scleral icterus.    Extraocular Movements: Extraocular movements intact.     Conjunctiva/sclera: Conjunctivae normal.     Pupils: Pupils are equal, round, and reactive to light.  Neck:     Thyroid: No thyromegaly.  Cardiovascular:     Rate and Rhythm: Normal rate and regular rhythm.     Pulses: Normal pulses.     Heart sounds: Normal heart sounds. No murmur heard. Pulmonary:     Effort: Pulmonary effort is normal. No respiratory distress.     Breath sounds: Normal breath sounds. No wheezing or rales.  Abdominal:     General: Abdomen is flat. Bowel sounds are normal. There is no distension.     Palpations: Abdomen is soft.     Tenderness: There is no abdominal tenderness.  Musculoskeletal:        General: No deformity. Normal range of motion.     Cervical back: Normal range of motion and neck supple.     Right lower leg: No edema.     Left lower leg: No edema.  Lymphadenopathy:     Cervical: No cervical adenopathy.  Skin:    General: Skin is warm and dry.      Findings: No rash.  Neurological:     General: No focal deficit present.     Mental Status: She is alert and oriented to person, place, and time. Mental status is at baseline.     Sensory: No sensory deficit.     Motor: No weakness.     Gait: Gait normal.  Psychiatric:  Mood and Affect: Mood normal.        Behavior: Behavior normal.        Thought Content: Thought content normal.        Judgment: Judgment normal.     Last depression screening scores PHQ 2/9 Scores 04/06/2021 10/19/2019 03/31/2018  PHQ - 2 Score 0 0 0  PHQ- 9 Score 0 - -   Last fall risk screening Fall Risk  04/06/2021  Falls in the past year? 1  Number falls in past yr: 0  Injury with Fall? 1   Last Audit-C alcohol use screening Alcohol Use Disorder Test (AUDIT) 10/19/2019  1. How often do you have a drink containing alcohol? 0  2. How many drinks containing alcohol do you have on a typical day when you are drinking? 0  3. How often do you have six or more drinks on one occasion? 0  AUDIT-C Score 0   A score of 3 or more in women, and 4 or more in men indicates increased risk for alcohol abuse, EXCEPT if all of the points are from question 1   No results found for any visits on 04/06/21.  Assessment & Plan     Problem List Items Addressed This Visit       Endocrine   Adult hypothyroidism    Previously with elevated TSH And synthroid dose was increased Continue Synthroid at current dose  Recheck TSH and adjust Synthroid as indicated        Relevant Orders   TSH     Other   Prediabetes    Recommend low carb diet Recheck A1c        Relevant Orders   Hemoglobin A1c   HLD (hyperlipidemia)    Reviewed last lipid panel Not currently on a statin Recheck FLP and CMP Discussed diet and exercise        Relevant Orders   Comprehensive metabolic panel   Lipid panel   Other Visit Diagnoses     Encounter for annual wellness visit (AWV) in Medicare patient    -  Primary   Encounter for annual  physical exam       Relevant Orders   TSH   Comprehensive metabolic panel   Hemoglobin A1c   Lipid panel   MM 3D SCREEN BREAST BILATERAL   Encounter for screening mammogram for malignant neoplasm of breast       Relevant Orders   MM 3D SCREEN BREAST BILATERAL       Routine Health Maintenance and Physical Exam  Exercise Activities and Dietary recommendations  Goals      Exercise 3x per week (30 min per time)     Recommend to exercise for 3 days a week for at least 30 minutes at a time.         Immunization History  Administered Date(s) Administered   Fluad Quad(high Dose 65+) 07/14/2019   Influenza, High Dose Seasonal PF 07/09/2017, 10/03/2018   PFIZER Comirnaty(Gray Top)Covid-19 Tri-Sucrose Vaccine 12/07/2020   PFIZER(Purple Top)SARS-COV-2 Vaccination 11/06/2019, 12/02/2019   Pneumococcal Conjugate-13 10/20/2014   Pneumococcal Polysaccharide-23 10/25/2015    Health Maintenance  Topic Date Due   Zoster Vaccines- Shingrix (1 of 2) Never done   COVID-19 Vaccine (4 - Booster for Pfizer series) 04/08/2021   TETANUS/TDAP  03/11/2027 (Originally 02/17/1965)   INFLUENZA VACCINE  04/10/2021   MAMMOGRAM  07/12/2021   DEXA SCAN  04/29/2023   COLONOSCOPY (Pts 45-65yr Insurance coverage will need to be confirmed)  12/06/2024  Hepatitis C Screening  Completed   PNA vac Low Risk Adult  Completed   HPV VACCINES  Aged Out    Discussed health benefits of physical activity, and encouraged her to engage in regular exercise appropriate for her age and condition.  Return in about 6 months (around 10/07/2021) for chronic disease f/u.     I,Essence Turner,acting as a Education administrator for Lavon Paganini, MD.,have documented all relevant documentation on the behalf of Lavon Paganini, MD,as directed by  Lavon Paganini, MD while in the presence of Lavon Paganini, MD.  I, Lavon Paganini, MD, have reviewed all documentation for this visit. The documentation on 04/06/21 for the exam,  diagnosis, procedures, and orders are all accurate and complete.   Gabryella Murfin, Dionne Bucy, MD, MPH [facility]

## 2021-04-06 NOTE — Assessment & Plan Note (Signed)
Previously with elevated TSH And synthroid dose was increased Continue Synthroid at current dose  Recheck TSH and adjust Synthroid as indicated

## 2021-04-06 NOTE — Assessment & Plan Note (Signed)
Reviewed last lipid panel Not currently on a statin Recheck FLP and CMP Discussed diet and exercise  

## 2021-04-06 NOTE — Patient Instructions (Signed)
   The CDC recommends two doses of Shingrix (the shingles vaccine) separated by 2 to 6 months for adults age 74 years and older. I recommend checking with your insurance plan regarding coverage for this vaccine.   

## 2021-04-06 NOTE — Assessment & Plan Note (Signed)
Recommend low carb diet °Recheck A1c  °

## 2021-04-07 DIAGNOSIS — E039 Hypothyroidism, unspecified: Secondary | ICD-10-CM | POA: Diagnosis not present

## 2021-04-07 DIAGNOSIS — E785 Hyperlipidemia, unspecified: Secondary | ICD-10-CM | POA: Diagnosis not present

## 2021-04-07 DIAGNOSIS — R7303 Prediabetes: Secondary | ICD-10-CM | POA: Diagnosis not present

## 2021-04-07 DIAGNOSIS — Z0000 Encounter for general adult medical examination without abnormal findings: Secondary | ICD-10-CM | POA: Diagnosis not present

## 2021-04-08 LAB — COMPREHENSIVE METABOLIC PANEL
ALT: 13 IU/L (ref 0–32)
AST: 17 IU/L (ref 0–40)
Albumin/Globulin Ratio: 2.1 (ref 1.2–2.2)
Albumin: 4.4 g/dL (ref 3.7–4.7)
Alkaline Phosphatase: 65 IU/L (ref 44–121)
BUN/Creatinine Ratio: 22 (ref 12–28)
BUN: 14 mg/dL (ref 8–27)
Bilirubin Total: 0.5 mg/dL (ref 0.0–1.2)
CO2: 23 mmol/L (ref 20–29)
Calcium: 9.4 mg/dL (ref 8.7–10.3)
Chloride: 99 mmol/L (ref 96–106)
Creatinine, Ser: 0.65 mg/dL (ref 0.57–1.00)
Globulin, Total: 2.1 g/dL (ref 1.5–4.5)
Glucose: 98 mg/dL (ref 65–99)
Potassium: 4.4 mmol/L (ref 3.5–5.2)
Sodium: 137 mmol/L (ref 134–144)
Total Protein: 6.5 g/dL (ref 6.0–8.5)
eGFR: 92 mL/min/{1.73_m2} (ref >59)

## 2021-04-08 LAB — LIPID PANEL
Chol/HDL Ratio: 3.5 ratio (ref 0.0–4.4)
Cholesterol, Total: 170 mg/dL (ref 100–199)
HDL: 48 mg/dL (ref >39)
LDL Chol Calc (NIH): 111 mg/dL — ABNORMAL HIGH (ref 0–99)
Triglycerides: 53 mg/dL (ref 0–149)
VLDL Cholesterol Cal: 11 mg/dL (ref 5–40)

## 2021-04-08 LAB — HEMOGLOBIN A1C
Est. average glucose Bld gHb Est-mCnc: 123 mg/dL
Hgb A1c MFr Bld: 5.9 % — ABNORMAL HIGH (ref 4.8–5.6)

## 2021-04-08 LAB — TSH: TSH: 0.078 u[IU]/mL — ABNORMAL LOW (ref 0.450–4.500)

## 2021-04-12 ENCOUNTER — Telehealth: Payer: Self-pay

## 2021-04-12 DIAGNOSIS — E039 Hypothyroidism, unspecified: Secondary | ICD-10-CM

## 2021-04-12 MED ORDER — LEVOTHYROXINE SODIUM 50 MCG PO TABS: 50.0000 ug | ORAL_TABLET | Freq: Every day | ORAL | 0 refills | Status: DC

## 2021-04-12 NOTE — Telephone Encounter (Signed)
Patient advised as directed below.Prescription sent to [facility]

## 2021-04-12 NOTE — Telephone Encounter (Signed)
-----   Message from Virginia Crews, MD sent at 04/10/2021  8:15 AM EDT ----- Normal labs, except for stable prediabetes and low TSH.  Looks like since changing timing of taking Synthroid, she does not need the higher dose.  Can go back to 22mg daily (ok to send new Rx)

## 2021-05-12 ENCOUNTER — Ambulatory Visit
Admission: RE | Admit: 2021-05-12 | Discharge: 2021-05-12 | Disposition: A | Discharge status: Home / Self Care | Payer: Medicare PPO | Source: Ambulatory Visit | Attending: Family Medicine | Admitting: Family Medicine

## 2021-05-12 ENCOUNTER — Other Ambulatory Visit: Payer: Self-pay

## 2021-05-12 DIAGNOSIS — Z1231 Encounter for screening mammogram for malignant neoplasm of breast: Secondary | ICD-10-CM | POA: Insufficient documentation

## 2021-05-12 DIAGNOSIS — Z0000 Encounter for general adult medical examination without abnormal findings: Secondary | ICD-10-CM | POA: Diagnosis not present

## 2021-05-20 ENCOUNTER — Other Ambulatory Visit: Payer: Self-pay | Admitting: Family Medicine

## 2021-05-20 DIAGNOSIS — E039 Hypothyroidism, unspecified: Secondary | ICD-10-CM

## 2021-05-20 NOTE — Telephone Encounter (Signed)
dc'd 04/12/21 Lyndel Pleasure CMA  reason: none

## 2021-07-12 ENCOUNTER — Other Ambulatory Visit: Payer: Self-pay | Admitting: Family Medicine

## 2021-07-12 DIAGNOSIS — E039 Hypothyroidism, unspecified: Secondary | ICD-10-CM

## 2021-07-12 NOTE — Telephone Encounter (Signed)
Requested medications are due for refill today.  yes  Requested medications are on the active medications list.  yes  Last refill. 04/12/2021  Future visit scheduled.   yes  Notes to clinic.  Failed protocol d/t abnormal labs.

## 2021-10-06 NOTE — Progress Notes (Signed)
Established patient visit   Patient: Janet Craig   DOB: Dec 20, 1945   76 y.o. Female  MRN: [id number] Visit Date: 10/09/2021  Today's healthcare provider: Lavon Paganini, MD   Chief Complaint  Patient presents with   Follow-up   Subjective    HPI  Hypothyroid, follow-up  Lab Results  Component Value Date   TSH 0.078 (L) 04/07/2021   TSH 10.200 (H) 09/19/2020   TSH 2.440 01/25/2020   T4TOTAL 5.4 04/19/2017    Wt Readings from Last 3 Encounters:  10/09/21 125 lb (56.7 kg)  04/06/21 114 lb 14.4 oz (52.1 kg)  09/19/20 126 lb 6.4 oz (57.3 kg)   She was last seen for hypothyroid 6 months ago.  Management since that visit includes back to 17mcg daily of Synthroid. She reports excellent compliance with treatment. She is not having side effects.   Symptoms: No change in energy level No constipation  No diarrhea No heat / cold intolerance  No nervousness No palpitations  No weight changes    -----------------------------------------------------------------------------------------  Prediabetes, Follow-up  Lab Results  Component Value Date   HGBA1C 5.9 (H) 04/07/2021   HGBA1C 6.1 (H) 09/19/2020   HGBA1C 6.0 (H) 01/25/2020   GLUCOSE 98 04/07/2021   GLUCOSE 72 09/19/2020   GLUCOSE 97 01/25/2020    Last seen for for this6 months ago.  Management since that visit includes Discussed diet and exercise . Current symptoms include none and have been stable.  Prior visit with dietician: no Current diet: in general, a "healthy" diet  , well balanced Current exercise: walking  Pertinent Labs:    Component Value Date/Time   CHOL 170 04/07/2021 0735   TRIG 53 04/07/2021 0735   CHOLHDL 3.5 04/07/2021 0735   CREATININE 0.65 04/07/2021 0735   CREATININE 0.49 (L) 02/25/2013 0302    Wt Readings from Last 3 Encounters:  10/09/21 125 lb (56.7 kg)  04/06/21 114 lb 14.4 oz (52.1 kg)  09/19/20 126 lb 6.4 oz (57.3 kg)     -----------------------------------------------------------------------------------------  Medications: Outpatient Medications Prior to Visit  Medication Sig   levothyroxine (SYNTHROID) 50 MCG tablet TAKE 1 TABLET(50 MCG) BY MOUTH DAILY   Multiple Vitamin tablet Take 1 tablet by mouth daily.    Omega-3 Fatty Acids (FISH OIL MAXIMUM STRENGTH) 1200 MG CPDR Take 1,200 mg by mouth daily.    No facility-administered medications prior to visit.    Review of Systems  Constitutional:  Negative for appetite change, chills, fatigue and fever.  Respiratory:  Negative for chest tightness and shortness of breath.   Cardiovascular:  Negative for chest pain and palpitations.  Gastrointestinal:  Negative for abdominal pain, nausea and vomiting.  Neurological:  Negative for dizziness and weakness.      Objective    BP 111/70 (BP Location: Left Arm, Patient Position: Sitting, Cuff Size: Normal)    Pulse 69    Temp 98.3 F (36.8 C) (Oral)    Resp 16    Wt 125 lb (56.7 kg)    BMI 21.12 kg/m  {Show previous vital signs (optional):23777}  Physical Exam Vitals reviewed.  Constitutional:      General: She is not in acute distress.    Appearance: Normal appearance. She is well-developed. She is not diaphoretic.  HENT:     Head: Normocephalic and atraumatic.  Eyes:     General: No scleral icterus.    Conjunctiva/sclera: Conjunctivae normal.  Neck:     Thyroid: No thyromegaly.  Cardiovascular:  Rate and Rhythm: Normal rate and regular rhythm.     Pulses: Normal pulses.     Heart sounds: Normal heart sounds. No murmur heard. Pulmonary:     Effort: Pulmonary effort is normal. No respiratory distress.     Breath sounds: Normal breath sounds. No wheezing, rhonchi or rales.  Musculoskeletal:     Cervical back: Neck supple.     Right lower leg: No edema.     Left lower leg: No edema.  Lymphadenopathy:     Cervical: No cervical adenopathy.  Skin:    General: Skin is warm and dry.      Findings: No rash.  Neurological:     Mental Status: She is alert and oriented to person, place, and time. Mental status is at baseline.  Psychiatric:        Mood and Affect: Mood normal.        Behavior: Behavior normal.      No results found for any visits on 10/09/21.  Assessment & Plan     Problem List Items Addressed This Visit       Endocrine   Adult hypothyroidism    Previously with low TSH Continue Synthroid at current dose  Recheck TSH and adjust Synthroid as indicated        Relevant Orders   TSH     Other   Prediabetes - Primary    Recommend low carb diet Recheck A1c       Relevant Orders   Hemoglobin A1c   HLD (hyperlipidemia)    Reviewed last lipid panel Not currently on a statin Recheck FLP and CMP at next visit Discussed diet and exercise         Return in about 6 months (around 04/08/2022) for CPE, AWV.      I, Lavon Paganini, MD, have reviewed all documentation for this visit. The documentation on 10/09/21 for the exam, diagnosis, procedures, and orders are all accurate and complete.   Georgio Hattabaugh, Dionne Bucy, MD, MPH [facility]

## 2021-10-09 ENCOUNTER — Other Ambulatory Visit: Payer: Self-pay

## 2021-10-09 ENCOUNTER — Ambulatory Visit (INDEPENDENT_AMBULATORY_CARE_PROVIDER_SITE_OTHER): Payer: Medicare PPO | Admitting: Family Medicine

## 2021-10-09 ENCOUNTER — Encounter: Payer: Self-pay | Admitting: Family Medicine

## 2021-10-09 VITALS — BP 111/70 | HR 69 | Temp 98.3°F | Resp 16 | Wt 125.0 lb

## 2021-10-09 DIAGNOSIS — R7303 Prediabetes: Secondary | ICD-10-CM

## 2021-10-09 DIAGNOSIS — E039 Hypothyroidism, unspecified: Secondary | ICD-10-CM | POA: Diagnosis not present

## 2021-10-09 DIAGNOSIS — E785 Hyperlipidemia, unspecified: Secondary | ICD-10-CM

## 2021-10-09 NOTE — Assessment & Plan Note (Signed)
Reviewed last lipid panel Not currently on a statin Recheck FLP and CMP at next visit Discussed diet and exercise

## 2021-10-09 NOTE — Assessment & Plan Note (Signed)
Previously with low TSH Continue Synthroid at current dose  Recheck TSH and adjust Synthroid as indicated

## 2021-10-09 NOTE — Assessment & Plan Note (Signed)
Recommend low carb diet °Recheck A1c  °

## 2021-10-10 ENCOUNTER — Other Ambulatory Visit: Payer: Self-pay | Admitting: Family Medicine

## 2021-10-10 ENCOUNTER — Telehealth: Payer: Self-pay

## 2021-10-10 DIAGNOSIS — E039 Hypothyroidism, unspecified: Secondary | ICD-10-CM

## 2021-10-10 LAB — HEMOGLOBIN A1C
Est. average glucose Bld gHb Est-mCnc: 126 mg/dL
Hgb A1c MFr Bld: 6 % — ABNORMAL HIGH (ref 4.8–5.6)

## 2021-10-10 LAB — TSH: TSH: 8.81 u[IU]/mL — ABNORMAL HIGH (ref 0.450–4.500)

## 2021-10-10 MED ORDER — LEVOTHYROXINE SODIUM 75 MCG PO TABS: 75.0000 ug | ORAL_TABLET | Freq: Every day | ORAL | 0 refills | Status: DC

## 2021-10-10 NOTE — Telephone Encounter (Signed)
Patient advised as directed below. Ordered labs. Prescription send in.

## 2021-10-10 NOTE — Telephone Encounter (Signed)
Requested medications are due for refill today.  yes  Requested medications are on the active medications list.  yes  Last refill. 07/12/2021  Future visit scheduled.   no  Notes to clinic.  Failed protocol d/t abnormal labs.    Requested Prescriptions  Pending Prescriptions Disp Refills   levothyroxine (SYNTHROID) 50 MCG tablet [Pharmacy Med Name: LEVOTHYROXINE 0.05MG  (50MCG) TAB] 90 tablet 0    Sig: TAKE 1 TABLET(50 MCG) BY MOUTH DAILY     Endocrinology:  Hypothyroid Agents Failed - 10/10/2021  6:36 AM      Failed - TSH needs to be rechecked within 3 months after an abnormal result. Refill until TSH is due.      Failed - TSH in normal range and within 360 days    TSH  Date Value Ref Range Status  10/09/2021 8.810 (H) 0.450 - 4.500 uIU/mL Final          Passed - Valid encounter within last 12 months    Recent Outpatient Visits           [facility], Dionne Bucy, MD   6 months ago Encounter for annual wellness visit (AWV) in Medicare patient   [facility] [place], Dionne Bucy, MD   1 year ago Adult hypothyroidism   [facility] [place], Dionne Bucy, MD   1 year ago Urethral caruncle   [facility] [place], Dionne Bucy, MD   1 year ago Urethral lesion   [place], Clearnce Sorrel, [place]

## 2021-10-10 NOTE — Telephone Encounter (Signed)
-----   Message from Virginia Crews, MD sent at 10/10/2021  8:29 AM EST ----- High TSH.  If taking levothyroxine regularly, recommend increasing Synthroid dose to 75 mcg and rechecking in 2 months. OK to send new Rx if needed.  Hemoglobin A1c, 3 month avg of blood sugars, is in prediabetic range.  In order to prevent progression to diabetes, recommend low carb diet and regular exercise.

## 2022-02-06 ENCOUNTER — Telehealth: Payer: Self-pay | Admitting: Family Medicine

## 2022-02-06 DIAGNOSIS — E039 Hypothyroidism, unspecified: Secondary | ICD-10-CM

## 2022-02-06 MED ORDER — LEVOTHYROXINE SODIUM 75 MCG PO TABS: 75.0000 ug | ORAL_TABLET | Freq: Every day | ORAL | 0 refills | Status: DC

## 2022-02-06 NOTE — Addendum Note (Signed)
Addended by: Shawna Orleans on: 02/06/2022 09:45 AM   Modules accepted: Orders

## 2022-02-06 NOTE — Telephone Encounter (Signed)
[facility] faxed refill request for the following medications:   levothyroxine (SYNTHROID) 75 MCG tablet   Please advise.

## 2022-04-30 ENCOUNTER — Ambulatory Visit: Payer: Medicare PPO | Admitting: Family Medicine

## 2022-04-30 ENCOUNTER — Encounter: Payer: Medicare PPO | Admitting: Family Medicine

## 2022-05-15 ENCOUNTER — Other Ambulatory Visit: Payer: Self-pay | Admitting: Family Medicine

## 2022-05-15 DIAGNOSIS — E039 Hypothyroidism, unspecified: Secondary | ICD-10-CM

## 2022-05-29 ENCOUNTER — Ambulatory Visit (INDEPENDENT_AMBULATORY_CARE_PROVIDER_SITE_OTHER): Payer: Medicare PPO

## 2022-05-29 VITALS — BP 102/60 | Ht 64.5 in | Wt 117.5 lb

## 2022-05-29 DIAGNOSIS — Z0000 Encounter for general adult medical examination without abnormal findings: Secondary | ICD-10-CM | POA: Diagnosis not present

## 2022-05-29 NOTE — Patient Instructions (Signed)
Janet Craig , Thank you for taking time to come for your Medicare Wellness Visit. I appreciate your ongoing commitment to your health goals. Please review the following plan we discussed and let me know if I can assist you in the future.   Screening recommendations/referrals: Colonoscopy: aged out Mammogram: pt to call and make appt Bone Density: 04/28/18 Recommended yearly ophthalmology/optometry visit for glaucoma screening and checkup Recommended yearly dental visit for hygiene and checkup  Vaccinations: Influenza vaccine: n.d Pneumococcal vaccine: 10/25/15 Tdap vaccine: n/d Shingles vaccine: n/d   Covid-19:11/06/19, 12/02/19, 12/07/20  Advanced directives: no  Conditions/risks identified: none  Next appointment: Follow up in one year for your annual wellness visit 06/03/23 @ 9 am in person   Preventive Care 75 Years and Older, Female Preventive care refers to lifestyle choices and visits with your health care provider that can promote health and wellness. What does preventive care include? A yearly physical exam. This is also called an annual well check. Dental exams once or twice a year. Routine eye exams. Ask your health care provider how often you should have your eyes checked. Personal lifestyle choices, including: Daily care of your teeth and gums. Regular physical activity. Eating a healthy diet. Avoiding tobacco and drug use. Limiting alcohol use. Practicing safe sex. Taking low-dose aspirin every day. Taking vitamin and mineral supplements as recommended by your health care provider. What happens during an annual well check? The services and screenings done by your health care provider during your annual well check will depend on your age, overall health, lifestyle risk factors, and family history of disease. Counseling  Your health care provider may ask you questions about your: Alcohol use. Tobacco use. Drug use. Emotional well-being. Home and relationship  well-being. Sexual activity. Eating habits. History of falls. Memory and ability to understand (cognition). Work and work Statistician. Reproductive health. Screening  You may have the following tests or measurements: Height, weight, and BMI. Blood pressure. Lipid and cholesterol levels. These may be checked every 5 years, or more frequently if you are over 66 years old. Skin check. Lung cancer screening. You may have this screening every year starting at age 75 if you have a 30-pack-year history of smoking and currently smoke or have quit within the past 15 years. Fecal occult blood test (FOBT) of the stool. You may have this test every year starting at age 75 Flexible sigmoidoscopy or colonoscopy. You may have a sigmoidoscopy every 5 years or a colonoscopy every 10 years starting at age 75 Hepatitis C blood test. Hepatitis B blood test. Sexually transmitted disease (STD) testing. Diabetes screening. This is done by checking your blood sugar (glucose) after you have not eaten for a while (fasting). You may have this done every 1-3 years. Bone density scan. This is done to screen for osteoporosis. You may have this done starting at age 75 Mammogram. This may be done every 1-2 years. Talk to your health care provider about how often you should have regular mammograms. Talk with your health care provider about your test results, treatment options, and if necessary, the need for more tests. Vaccines  Your health care provider may recommend certain vaccines, such as: Influenza vaccine. This is recommended every year. Tetanus, diphtheria, and acellular pertussis (Tdap, Td) vaccine. You may need a Td booster every 10 years. Zoster vaccine. You may need this after age 70. Pneumococcal 13-valent conjugate (PCV13) vaccine. One dose is recommended after age 75 Pneumococcal polysaccharide (PPSV23) vaccine. One dose is recommended after age  65. Talk to your health care provider about which  screenings and vaccines you need and how often you need them. This information is not intended to replace advice given to you by your health care provider. Make sure you discuss any questions you have with your health care provider. Document Released: 09/23/2015 Document Revised: 05/16/2016 Document Reviewed: 06/28/2015 Elsevier Interactive Patient Education  2017 [place] Prevention in the Home Falls can cause injuries. They can happen to people of all ages. There are many things you can do to make your home safe and to help prevent falls. What can I do on the outside of my home? Regularly fix the edges of walkways and driveways and fix any cracks. Remove anything that might make you trip as you walk through a door, such as a raised step or threshold. Trim any bushes or trees on the path to your home. Use bright outdoor lighting. Clear any walking paths of anything that might make someone trip, such as rocks or tools. Regularly check to see if handrails are loose or broken. Make sure that both sides of any steps have handrails. Any raised decks and porches should have guardrails on the edges. Have any leaves, snow, or ice cleared regularly. Use sand or salt on walking paths during winter. Clean up any spills in your garage right away. This includes oil or grease spills. What can I do in the bathroom? Use night lights. Install grab bars by the toilet and in the tub and shower. Do not use towel bars as grab bars. Use non-skid mats or decals in the tub or shower. If you need to sit down in the shower, use a plastic, non-slip stool. Keep the floor dry. Clean up any water that spills on the floor as soon as it happens. Remove soap buildup in the tub or shower regularly. Attach bath mats securely with double-sided non-slip rug tape. Do not have throw rugs and other things on the floor that can make you trip. What can I do in the bedroom? Use night lights. Make sure that you have a  light by your bed that is easy to reach. Do not use any sheets or blankets that are too big for your bed. They should not hang down onto the floor. Have a firm chair that has side arms. You can use this for support while you get dressed. Do not have throw rugs and other things on the floor that can make you trip. What can I do in the kitchen? Clean up any spills right away. Avoid walking on wet floors. Keep items that you use a lot in easy-to-reach places. If you need to reach something above you, use a strong step stool that has a grab bar. Keep electrical cords out of the way. Do not use floor polish or wax that makes floors slippery. If you must use wax, use non-skid floor wax. Do not have throw rugs and other things on the floor that can make you trip. What can I do with my stairs? Do not leave any items on the stairs. Make sure that there are handrails on both sides of the stairs and use them. Fix handrails that are broken or loose. Make sure that handrails are as long as the stairways. Check any carpeting to make sure that it is firmly attached to the stairs. Fix any carpet that is loose or worn. Avoid having throw rugs at the top or bottom of the stairs. If you do have  throw rugs, attach them to the floor with carpet tape. Make sure that you have a light switch at the top of the stairs and the bottom of the stairs. If you do not have them, ask someone to add them for you. What else can I do to help prevent falls? Wear shoes that: Do not have high heels. Have rubber bottoms. Are comfortable and fit you well. Are closed at the toe. Do not wear sandals. If you use a stepladder: Make sure that it is fully opened. Do not climb a closed stepladder. Make sure that both sides of the stepladder are locked into place. Ask someone to hold it for you, if possible. Clearly mark and make sure that you can see: Any grab bars or handrails. First and last steps. Where the edge of each step  is. Use tools that help you move around (mobility aids) if they are needed. These include: Canes. Walkers. Scooters. Crutches. Turn on the lights when you go into a dark area. Replace any light bulbs as soon as they burn out. Set up your furniture so you have a clear path. Avoid moving your furniture around. If any of your floors are uneven, fix them. If there are any pets around you, be aware of where they are. Review your medicines with your doctor. Some medicines can make you feel dizzy. This can increase your chance of falling. Ask your doctor what other things that you can do to help prevent falls. This information is not intended to replace advice given to you by your health care provider. Make sure you discuss any questions you have with your health care provider. Document Released: 06/23/2009 Document Revised: 02/02/2016 Document Reviewed: 10/01/2014 Elsevier Interactive Patient Education  2017 [employer].

## 2022-05-29 NOTE — Progress Notes (Signed)
Subjective:   Janet Craig is a 76 y.o. female who presents for Medicare Annual (Subsequent) preventive examination.  Review of Systems     Cardiac Risk Factors include: advanced age (>86mn, >>7women);dyslipidemia     Objective:    Today's Vitals   05/29/22 0931  BP: 102/60  Weight: 117 lb 8 oz (53.3 kg)  Height: 5' 4.5" (1.638 m)   Body mass index is 19.86 kg/m.     05/29/2022    9:36 AM 10/19/2019    2:16 PM 03/31/2018    1:51 PM 03/29/2017   11:11 AM  Advanced Directives  Does Patient Have a Medical Advance Directive? No Yes Yes Yes  Type of ACorporate treasurerof APelionLiving will HBoazLiving will H[place]Living will  Copy of [place] Chart?  No - copy requested No - copy requested No - copy requested  Would patient like information on creating a medical advance directive? No - Patient declined       Current Medications (verified) Outpatient Encounter Medications as of 05/29/2022  Medication Sig   levothyroxine (SYNTHROID) 75 MCG tablet TAKE 1 TABLET(75 MCG) BY MOUTH DAILY BEFORE BREAKFAST   Multiple Vitamin tablet Take 1 tablet by mouth daily.    Omega-3 Fatty Acids (FISH OIL MAXIMUM STRENGTH) 1200 MG CPDR Take 1,200 mg by mouth daily.    No facility-administered encounter medications on file as of 05/29/2022.    Allergies (verified) Neomycin-bacitracin zn-polymyx, Penicillins, and Miconazole nitrate   History: Past Medical History:  Diagnosis Date   Hyperlipidemia    Past Surgical History:  Procedure Laterality Date   ABDOMINAL HYSTERECTOMY     APPENDECTOMY     COLONOSCOPY  12/2005   EYE SURGERY     LUMBAR MICRODISCECTOMY  11/2010   TOTAL HIP ARTHROPLASTY Left 02/24/13   Family History  Problem Relation Age of Onset   Breast cancer Mother 559  Lung cancer Mother    CVA Father    Lung cancer Father    Melanoma Father    Multiple sclerosis Sister    Diabetes  Sister    Atrial fibrillation Sister    Lupus Brother    Colon cancer Paternal Aunt    Lung cancer Maternal Grandfather    Social History   Socioeconomic History   Marital status: Married    Spouse name: Not on file   Number of children: 1   Years of education: Not on file   Highest education level: Master's degree (e.g., MA, MS, MEng, MEd, MSW, MBA)  Occupational History   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  Tobacco Use   Smoking status: Never   Smokeless tobacco: Never  Vaping Use   Vaping Use: Never used  Substance and Sexual Activity   Alcohol use: No   Drug use: No   Sexual activity: Yes  Other Topics Concern   Not on file  Social History Narrative   Not on file   Social Determinants of Health   Financial Resource Strain: Low Risk  (05/29/2022)   Overall Financial Resource Strain (CARDIA)    Difficulty of Paying Living Expenses: Not hard at all  Food Insecurity: No Food Insecurity (05/29/2022)   Hunger Vital Sign    Worried About Running Out of Food in the Last Year: Never true    Ran Out of Food in the Last Year: Never true  Transportation Needs: No Transportation Needs (05/29/2022)  PRAPARE - Hydrologist (Medical): No    Lack of Transportation (Non-Medical): No  Physical Activity: Sufficiently Active (05/29/2022)   Exercise Vital Sign    Days of Exercise per Week: 5 days    Minutes of Exercise per Session: 70 min  Stress: No Stress Concern Present (05/29/2022)   [place]    Feeling of Stress : Not at all  Social Connections: Moderately Integrated (05/29/2022)   Social Connection and Isolation Panel [NHANES]    Frequency of Communication with Friends and Family: More than three times a week    Frequency of Social Gatherings with Friends and Family: Once a week    Attends Religious Services: More than 4 times per year    Active Member of [employer] or  Organizations: No    Attends Music therapist: Never    Marital Status: Married    Tobacco Counseling Counseling given: Not Answered   Clinical Intake:  Pre-visit preparation completed: Yes  Pain : No/denies pain     Nutritional Risks: None Diabetes: No  How often do you need to have someone help you when you read instructions, pamphlets, or other written materials from your doctor or pharmacy?: 1 - Never  Diabetic?no  Interpreter Needed?: No  Information entered by :: Kirke Shaggy, LPN   Activities of Daily Living    05/29/2022    9:37 AM 10/09/2021    9:29 AM  In your present state of health, do you have any difficulty performing the following activities:  Hearing? 0 0  Vision? 0 0  Difficulty concentrating or making decisions? 0 0  Walking or climbing stairs? 0 0  Dressing or bathing? 0 0  Doing errands, shopping? 0 0  Preparing Food and eating ? N   Using the Toilet? N   In the past six months, have you accidently leaked urine? N   Do you have problems with loss of bowel control? N   Managing your Medications? N   Managing your Finances? N   Housekeeping or managing your Housekeeping? N     Patient Care Team: Virginia Crews, MD as PCP - General (Family Medicine) Birder Robson, MD as Referring Physician (Ophthalmology) Jerrol Banana., MD (Family Medicine)  Indicate any recent Medical Services you may have received from other than [facility] providers in the past year (date may be approximate).     Assessment:   This is a routine wellness examination for Charolette.  Hearing/Vision screen Hearing Screening - Comments:: No aids Vision Screening - Comments:: Readers- [facility]  Dietary issues and exercise activities discussed: Current Exercise Habits: Home exercise routine, Type of exercise: walking, Time (Minutes): 60, Frequency (Times/Week): 5, Weekly Exercise (Minutes/Week): 300, Intensity: Mild   Goals Addressed              This Visit's Progress    DIET - EAT MORE FRUITS AND VEGETABLES         Depression Screen    05/29/2022    9:34 AM 10/09/2021    9:29 AM 04/06/2021   10:19 AM 10/19/2019    2:12 PM 03/31/2018    1:51 PM 03/29/2017   11:14 AM 03/29/2017   11:12 AM  PHQ 2/9 Scores  PHQ - 2 Score 0 0 0 0 0 0 0  PHQ- 9 Score 0  0   0     Fall Risk    05/29/2022    9:37  AM 10/09/2021    9:29 AM 04/06/2021   10:19 AM 10/19/2019    2:16 PM 03/31/2018    1:51 PM  Fall Risk   Falls in the past year? 0 0 1 0 No  Number falls in past yr: 0 0 0 0   Injury with Fall? 0 0 1 0   Risk for fall due to : No Fall Risks      Follow up Falls prevention discussed        FALL RISK PREVENTION PERTAINING TO THE HOME:  Any stairs in or around the home? Yes  If so, are there any without handrails? No  Home free of loose throw rugs in walkways, pet beds, electrical cords, etc? Yes  Adequate lighting in your home to reduce risk of falls? Yes   ASSISTIVE DEVICES UTILIZED TO PREVENT FALLS:  Life alert? No  Use of a cane, walker or w/c? No  Grab bars in the bathroom? No  Shower chair or bench in shower? Yes  Elevated toilet seat or a handicapped toilet? No   TIMED UP AND GO:  Was the test performed? Yes .  Length of time to ambulate 10 feet: 4 sec.   Gait steady and fast without use of assistive device  Cognitive Function:        05/29/2022    9:44 AM 10/19/2019    2:19 PM 03/29/2017   11:14 AM  6CIT Screen  What Year? 0 points 0 points 0 points  What month? 0 points 0 points 0 points  What time? 0 points 0 points 0 points  Count back from 20 0 points 0 points 0 points  Months in reverse 0 points 0 points 0 points  Repeat phrase 2 points 0 points 0 points  Total Score 2 points 0 points 0 points    Immunizations Immunization History  Administered Date(s) Administered   Fluad Quad(high Dose 65+) 07/14/2019   Influenza, High Dose Seasonal PF 07/09/2017, 10/03/2018   PFIZER Comirnaty(Gray  Top)Covid-19 Tri-Sucrose Vaccine 12/07/2020   PFIZER(Purple Top)SARS-COV-2 Vaccination 11/06/2019, 12/02/2019   Pneumococcal Conjugate-13 10/20/2014   Pneumococcal Polysaccharide-23 10/25/2015    TDAP status: Up to date  Flu Vaccine status: Declined, Education has been provided regarding the importance of this vaccine but patient still declined. Advised may receive this vaccine at local pharmacy or Health Dept. Aware to provide a copy of the vaccination record if obtained from local pharmacy or Health Dept. Verbalized acceptance and understanding.  Pneumococcal vaccine status: Up to date  Covid-19 vaccine status: Completed vaccines  Qualifies for Shingles Vaccine? Yes   Zostavax completed No   Shingrix Completed?: No.    Education has been provided regarding the importance of this vaccine. Patient has been advised to call insurance company to determine out of pocket expense if they have not yet received this vaccine. Advised may also receive vaccine at local pharmacy or Health Dept. Verbalized acceptance and understanding.  Screening Tests Health Maintenance  Topic Date Due   Zoster Vaccines- Shingrix (1 of 2) Never done   COVID-19 Vaccine (4 - Pfizer series) 02/01/2021   INFLUENZA VACCINE  04/10/2022   TETANUS/TDAP  03/11/2027 (Originally 02/17/1965)   DEXA SCAN  04/29/2023   COLONOSCOPY (Pts 45-61yr Insurance coverage will need to be confirmed)  12/06/2024   Pneumonia Vaccine 75 Years old  Completed   Hepatitis C Screening  Completed   HPV VACCINES  Aged Out    Health Maintenance  Health Maintenance Due  Topic Date Due  Zoster Vaccines- Shingrix (1 of 2) Never done   COVID-19 Vaccine (4 - Pfizer series) 02/01/2021   INFLUENZA VACCINE  04/10/2022    Colorectal cancer screening: No longer required.   Mammogram status: Completed 05/12/21. Repeat every year- pt to call and make appt  Bone Density status: Completed 04/28/18. Results reflect: Bone density results: OSTEOPENIA.  Repeat every 5 years.  Lung Cancer Screening: (Low Dose CT Chest recommended if Age 48-80 years, 30 pack-year currently smoking OR have quit w/in 15years.) does not qualify.   Additional Screening:  Hepatitis C Screening: does qualify; Completed 02/12/12  Vision Screening: Recommended annual ophthalmology exams for early detection of glaucoma and other disorders of the eye. Is the patient up to date with their annual eye exam?  Yes  Who is the provider or what is the name of the office in which the patient attends annual eye exams? [place] If pt is not established with a provider, would they like to be referred to a provider to establish care? No .   Dental Screening: Recommended annual dental exams for proper oral hygiene  Community Resource Referral / Chronic Care Management: CRR required this visit?  No   CCM required this visit?  No      Plan:     I have personally reviewed and noted the following in the patient's chart:   Medical and social history Use of alcohol, tobacco or illicit drugs  Current medications and supplements including opioid prescriptions. Patient is not currently taking opioid prescriptions. Functional ability and status Nutritional status Physical activity Advanced directives List of other physicians Hospitalizations, surgeries, and ER visits in previous 12 months Vitals Screenings to include cognitive, depression, and falls Referrals and appointments  In addition, I have reviewed and discussed with patient certain preventive protocols, quality metrics, and best practice recommendations. A written personalized care plan for preventive services as well as general preventive health recommendations were provided to patient.     Dionisio David, LPN   [id number]/83/[id number]   Nurse Notes: none

## 2022-05-30 ENCOUNTER — Other Ambulatory Visit: Payer: Self-pay | Admitting: Family Medicine

## 2022-05-30 DIAGNOSIS — Z1231 Encounter for screening mammogram for malignant neoplasm of breast: Secondary | ICD-10-CM

## 2022-06-11 ENCOUNTER — Telehealth: Payer: Self-pay

## 2022-06-11 ENCOUNTER — Ambulatory Visit: Payer: Medicare PPO | Admitting: Family Medicine

## 2022-06-14 ENCOUNTER — Other Ambulatory Visit: Payer: Self-pay | Admitting: Family Medicine

## 2022-06-14 ENCOUNTER — Telehealth: Payer: Self-pay | Admitting: Family Medicine

## 2022-06-14 DIAGNOSIS — E039 Hypothyroidism, unspecified: Secondary | ICD-10-CM

## 2022-06-14 NOTE — Telephone Encounter (Signed)
Pt requests 30 day supply   Medication Refill - Medication: levothyroxine (SYNTHROID) 75 MCG tablet  Has the patient contacted their pharmacy? Yes.   Pt told to contact provider  Preferred Pharmacy (with phone number or street name):  [facility] [place], [place] - [address] Phone:  [phone]  Fax:  [phone]     Has the patient been seen for an appointment in the last year OR does the patient have an upcoming appointment? Yes.    Agent: Please be advised that RX refills may take up to 3 business days. We ask that you follow-up with your pharmacy.

## 2022-06-14 NOTE — Telephone Encounter (Signed)
Requested Prescriptions  Refused Prescriptions Disp Refills  . levothyroxine (SYNTHROID) 75 MCG tablet 90 tablet 0    Sig: TAKE 1 TABLET(75 MCG) BY MOUTH DAILY BEFORE BREAKFAST     Endocrinology:  Hypothyroid Agents Failed - 06/14/2022 11:27 AM      Failed - TSH in normal range and within 360 days    TSH  Date Value Ref Range Status  10/09/2021 8.810 (H) 0.450 - 4.500 uIU/mL Final         Passed - Valid encounter within last 12 months    Recent Outpatient Visits          8 months ago [place], Dionne Bucy, MD   1 year ago Encounter for annual wellness visit (AWV) in Medicare patient   [facility] [place], Dionne Bucy, MD   1 year ago Adult hypothyroidism   [facility] [place], Dionne Bucy, MD   2 years ago Urethral caruncle   [facility] [place], Dionne Bucy, MD   2 years ago Urethral lesion   [facility] [place], Clearnce Sorrel, [place]      Future Appointments            In 3 weeks [place], Dionne Bucy, MD [facility], [place]

## 2022-06-14 NOTE — Telephone Encounter (Signed)
Pt called, left detailed VM and advised if had any additional questions to call back.

## 2022-06-14 NOTE — Telephone Encounter (Signed)
[facility] pharmacy called, spoke with Tech about levothyroxine that was sent on 05/15/22 #90/0, who states insurance is saying too soon for refill. Pt can pu medication tomorrow for approval thru insurance.

## 2022-06-15 NOTE — Telephone Encounter (Signed)
Called [facility] pharmacy and they report patient picked up 90 day supply of levothyroxine on 05/15/22. She reports she will take the bottle in that shows that she only picked up partial.

## 2022-06-15 NOTE — Telephone Encounter (Signed)
Pt called saying she only picked up 30 days of the her medication instead of the 90 day.  The pharmacy will not give her the remainder of the medication  CB@  [phone]

## 2022-06-28 ENCOUNTER — Ambulatory Visit
Admission: RE | Admit: 2022-06-28 | Discharge: 2022-06-28 | Disposition: A | Discharge status: Home / Self Care | Payer: Medicare PPO | Source: Ambulatory Visit | Attending: Family Medicine | Admitting: Family Medicine

## 2022-06-28 DIAGNOSIS — Z1231 Encounter for screening mammogram for malignant neoplasm of breast: Secondary | ICD-10-CM | POA: Insufficient documentation

## 2022-07-05 NOTE — Progress Notes (Signed)
I,Sulibeya S Dimas,acting as a Education administrator for Lavon Paganini, MD.,have documented all relevant documentation on the behalf of Lavon Paganini, MD,as directed by  Lavon Paganini, MD while in the presence of Lavon Paganini, MD.    Complete physical exam   Patient: Janet Craig   DOB: [birth year]   76 y.o. Female  MRN: [id number] Visit Date: 07/09/2022  Today's healthcare provider: Lavon Paganini, MD   Chief Complaint  Patient presents with   Annual Exam   Subjective    Janet Craig is a 76 y.o. female who presents today for a complete physical exam.  She reports consuming a general diet.  walking  She generally feels well. She reports sleeping well. She does have additional problems to discuss today.  HPI  05/29/22 AWV  Has not had synthroid since 10/12 after running out (issue with insurance at the pharmacy)  Gets occasional ear aches and sinus pain (always on R side) - only lasts a few hours at a time - will try antihistamine and flonase  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  Tobacco Use   Smoking status: Never   Smokeless tobacco: Never  Vaping Use   Vaping Use: Never used  Substance and Sexual Activity   Alcohol use: No   Drug use: No   Sexual activity: Yes  Other Topics Concern   Not on file  Social History Narrative   Not on file   Social Determinants of Health   Financial Resource Strain: Low Risk  (05/29/2022)    Overall Financial Resource Strain (CARDIA)    Difficulty of Paying Living Expenses: Not hard at all  Food Insecurity: No Food Insecurity (05/29/2022)   Hunger Vital Sign    Worried About Running Out of Food in the Last Year: Never true    [place] in the Last Year: Never true  Transportation Needs: No Transportation Needs (05/29/2022)   PRAPARE - Hydrologist (Medical): No    Lack of Transportation (Non-Medical): No  Physical Activity: Sufficiently Active (05/29/2022)   Exercise Vital Sign    Days of Exercise per Week: 5 days    Minutes of Exercise per Session: 70 min  Stress: No Stress Concern Present (05/29/2022)   [place]    Feeling of Stress : Not at all  Social Connections: Moderately Integrated (05/29/2022)   Social Connection and Isolation Panel [NHANES]    Frequency of Communication with Friends and Family: More than three times a week    Frequency of Social Gatherings with Friends and Family: Once a week    Attends Religious Services: More than 4 times per year    Active Member of [employer] or Organizations: No    Attends Archivist Meetings: Never    Marital Status: Married  Intimate  Partner Violence: Not At Risk (05/29/2022)   Humiliation, Afraid, Rape, and Kick questionnaire    Fear of Current or Ex-Partner: No    Emotionally Abused: No    Physically Abused: No    Sexually Abused: No   Family Status  Relation Name Status   Mother  Deceased   Father  Deceased at age 17   Sister  Alive   Brother  Alive   Brother  Chief Financial Officer  (Not Specified)   MGF  (Not Specified)   Family History  Problem Relation Age of Onset   Breast cancer Mother 58   Lung cancer Mother    CVA Father    Lung cancer Father    Melanoma Father    Multiple sclerosis Sister    Diabetes Sister    Atrial fibrillation Sister    Lupus Brother    Colon cancer Paternal Aunt    Lung  cancer Maternal Grandfather    Allergies  Allergen Reactions   Neomycin-Bacitracin Zn-Polymyx    Penicillins    Miconazole Nitrate Rash    Patient Care Team: Virginia Crews, MD as PCP - General (Family Medicine) Birder Robson, MD as Referring Physician (Ophthalmology) Jerrol Banana., MD (Family Medicine)   Medications: Outpatient Medications Prior to Visit  Medication Sig   Multiple Vitamin tablet Take 1 tablet by mouth daily.    Omega-3 Fatty Acids (FISH OIL MAXIMUM STRENGTH) 1200 MG CPDR Take 1,200 mg by mouth daily.    [DISCONTINUED] levothyroxine (SYNTHROID) 75 MCG tablet TAKE 1 TABLET(75 MCG) BY MOUTH DAILY BEFORE BREAKFAST   No facility-administered medications prior to visit.    Review of Systems  All other systems reviewed and are negative.   Last CBC Lab Results  Component Value Date   WBC 4.9 07/25/2018   HGB 12.4 07/25/2018   HCT 37.5 07/25/2018   MCV 87 07/25/2018   MCH 28.9 07/25/2018   RDW 12.8 07/25/2018   PLT 336 99/24/2683   Last metabolic panel Lab Results  Component Value Date   GLUCOSE 98 04/07/2021   NA 137 04/07/2021   K 4.4 04/07/2021   CL 99 04/07/2021   CO2 23 04/07/2021   BUN 14 04/07/2021   CREATININE 0.65 04/07/2021   EGFR 92 04/07/2021   CALCIUM 9.4 04/07/2021   PROT 6.5 04/07/2021   ALBUMIN 4.4 04/07/2021   LABGLOB 2.1 04/07/2021   AGRATIO 2.1 04/07/2021   BILITOT 0.5 04/07/2021   ALKPHOS 65 04/07/2021   AST 17 04/07/2021   ALT 13 04/07/2021   ANIONGAP 6 (L) 02/25/2013   Last lipids Lab Results  Component Value Date   CHOL 170 04/07/2021   HDL 48 04/07/2021   LDLCALC 111 (H) 04/07/2021   TRIG 53 04/07/2021   CHOLHDL 3.5 04/07/2021   Last hemoglobin A1c Lab Results  Component Value Date   HGBA1C 6.0 (H) 10/09/2021   Last thyroid functions Lab Results  Component Value Date   TSH 8.810 (H) 10/09/2021   T4TOTAL 5.4 04/19/2017      Objective    BP 118/73 (BP Location: Left Arm, Patient  Position: Sitting, Cuff Size: Normal)   Pulse (!) 56   Temp 98.3 F (36.8 C) (Oral)   Resp 16   Ht 5' 4.5" (1.638 m)   Wt 119 lb 14.4 oz (54.4 kg)   BMI 20.26 kg/m  BP Readings from Last 3 Encounters:  07/09/22 118/73  05/29/22 102/60  10/09/21 111/70   Wt Readings from Last  3 Encounters:  07/09/22 119 lb 14.4 oz (54.4 kg)  05/29/22 117 lb 8 oz (53.3 kg)  10/09/21 125 lb (56.7 kg)       Physical Exam Vitals reviewed.  Constitutional:      General: She is not in acute distress.    Appearance: Normal appearance. She is well-developed. She is not diaphoretic.  HENT:     Head: Normocephalic and atraumatic.     Right Ear: Tympanic membrane, ear canal and external ear normal.     Left Ear: Tympanic membrane, ear canal and external ear normal.     Nose: Nose normal.     Mouth/Throat:     Mouth: Mucous membranes are moist.     Pharynx: Oropharynx is clear. No oropharyngeal exudate.  Eyes:     General: No scleral icterus.    Conjunctiva/sclera: Conjunctivae normal.     Pupils: Pupils are equal, round, and reactive to light.  Neck:     Thyroid: No thyromegaly.  Cardiovascular:     Rate and Rhythm: Normal rate and regular rhythm.     Pulses: Normal pulses.     Heart sounds: Normal heart sounds. No murmur heard. Pulmonary:     Effort: Pulmonary effort is normal. No respiratory distress.     Breath sounds: Normal breath sounds. No wheezing or rales.  Abdominal:     General: There is no distension.     Palpations: Abdomen is soft.     Tenderness: There is no abdominal tenderness.  Musculoskeletal:        General: No deformity.     Cervical back: Neck supple.     Right lower leg: No edema.     Left lower leg: No edema.  Lymphadenopathy:     Cervical: No cervical adenopathy.  Skin:    General: Skin is warm and dry.     Findings: No rash.  Neurological:     Mental Status: She is alert and oriented to person, place, and time. Mental status is at baseline.     Sensory: No  sensory deficit.     Motor: No weakness.     Gait: Gait normal.  Psychiatric:        Mood and Affect: Mood normal.        Behavior: Behavior normal.        Thought Content: Thought content normal.       Last depression screening scores    07/09/2022    8:19 AM 05/29/2022    9:34 AM 10/09/2021    9:29 AM  PHQ 2/9 Scores  PHQ - 2 Score 0 0 0  PHQ- 9 Score 0 0    Last fall risk screening    07/09/2022    8:19 AM  Fall Risk   Falls in the past year? 0  Number falls in past yr: 0  Injury with Fall? 0  Risk for fall due to : No Fall Risks  Follow up Falls evaluation completed   Last Audit-C alcohol use screening    07/09/2022    8:19 AM  Alcohol Use Disorder Test (AUDIT)  1. How often do you have a drink containing alcohol? 0  2. How many drinks containing alcohol do you have on a typical day when you are drinking? 0  3. How often do you have six or more drinks on one occasion? 0  AUDIT-C Score 0   A score of 3 or more in women, and 4 or more in men indicates increased risk  for alcohol abuse, EXCEPT if all of the points are from question 1   No results found for any visits on 07/09/22.  Assessment & Plan    Routine Health Maintenance and Physical Exam  Exercise Activities and Dietary recommendations  Goals      DIET - EAT MORE FRUITS AND VEGETABLES     Exercise 3x per week (30 min per time)     Recommend to exercise for 3 days a week for at least 30 minutes at a time.         Immunization History  Administered Date(s) Administered   Fluad Quad(high Dose 65+) 07/14/2019   Influenza, High Dose Seasonal PF 07/09/2017, 10/03/2018   PFIZER Comirnaty(Gray Top)Covid-19 Tri-Sucrose Vaccine 12/07/2020   PFIZER(Purple Top)SARS-COV-2 Vaccination 11/06/2019, 12/02/2019   Pneumococcal Conjugate-13 10/20/2014   Pneumococcal Polysaccharide-23 10/25/2015    Health Maintenance  Topic Date Due   Zoster Vaccines- Shingrix (1 of 2) Never done   COVID-19 Vaccine (4 -  Pfizer series) 02/01/2021   INFLUENZA VACCINE  12/09/2022 (Originally 04/10/2022)   TETANUS/TDAP  03/11/2027 (Originally [birth year]/17/1966)   DEXA SCAN  04/29/2023   Medicare Annual Wellness (AWV)  05/30/2023   COLONOSCOPY (Pts 45-65yr Insurance coverage will need to be confirmed)  12/06/2024   Pneumonia Vaccine 75 Years old  Completed   Hepatitis C Screening  Completed   HPV VACCINES  Aged Out    Discussed health benefits of physical activity, and encouraged her to engage in regular exercise appropriate for her age and condition.  Problem List Items Addressed This Visit       Endocrine   Adult hypothyroidism    Previously with elevated TSH and synthroid dose adjusted Has not been rechecked Now out of meds x2 weeks Recheck today and resume synthroid at previous dose Will likely need to recheck again in 6 weeks to consider dose titration      Relevant Medications   levothyroxine (SYNTHROID) 75 MCG tablet   Other Relevant Orders   TSH   TSH     Other   Prediabetes    Recommend low carb diet Recheck A1c       Relevant Orders   Hemoglobin A1c   HLD (hyperlipidemia)    Reviewed last lipid panel Not currently on a statin Recheck FLP and CMP Discussed diet and exercise       Relevant Orders   Comprehensive metabolic panel   Lipid Panel With LDL/HDL Ratio   Other Visit Diagnoses     Encounter for annual physical exam    -  Primary   Relevant Orders   Comprehensive metabolic panel   Lipid Panel With LDL/HDL Ratio   Hemoglobin A1c   TSH   Hypothyroidism       Relevant Medications   levothyroxine (SYNTHROID) 75 MCG tablet        Return in about 6 months (around 01/08/2023) for chronic disease f/u.     I, ALavon Paganini MD, have reviewed all documentation for this visit. The documentation on 07/09/22 for the exam, diagnosis, procedures, and orders are all accurate and complete.   Taite Baldassari, ADionne Bucy MD, MPH [facility]

## 2022-07-09 ENCOUNTER — Ambulatory Visit (INDEPENDENT_AMBULATORY_CARE_PROVIDER_SITE_OTHER): Payer: Medicare PPO | Admitting: Family Medicine

## 2022-07-09 ENCOUNTER — Encounter: Payer: Self-pay | Admitting: Family Medicine

## 2022-07-09 VITALS — BP 118/73 | HR 56 | Temp 98.3°F | Resp 16 | Ht 64.5 in | Wt 119.9 lb

## 2022-07-09 DIAGNOSIS — E785 Hyperlipidemia, unspecified: Secondary | ICD-10-CM

## 2022-07-09 DIAGNOSIS — E039 Hypothyroidism, unspecified: Secondary | ICD-10-CM

## 2022-07-09 DIAGNOSIS — Z23 Encounter for immunization: Secondary | ICD-10-CM

## 2022-07-09 DIAGNOSIS — Z0000 Encounter for general adult medical examination without abnormal findings: Secondary | ICD-10-CM | POA: Diagnosis not present

## 2022-07-09 DIAGNOSIS — R7303 Prediabetes: Secondary | ICD-10-CM | POA: Diagnosis not present

## 2022-07-09 MED ORDER — LEVOTHYROXINE SODIUM 75 MCG PO TABS: 75.0000 ug | ORAL_TABLET | Freq: Every day | ORAL | 0 refills | Status: DC

## 2022-07-09 NOTE — Assessment & Plan Note (Signed)
Reviewed last lipid panel Not currently on a statin Recheck FLP and CMP Discussed diet and exercise  

## 2022-07-09 NOTE — Assessment & Plan Note (Signed)
Recommend low carb diet °Recheck A1c  °

## 2022-07-09 NOTE — Assessment & Plan Note (Signed)
Previously with elevated TSH and synthroid dose adjusted Has not been rechecked Now out of meds x2 weeks Recheck today and resume synthroid at previous dose Will likely need to recheck again in 6 weeks to consider dose titration

## 2022-07-10 LAB — COMPREHENSIVE METABOLIC PANEL
ALT: 21 IU/L (ref 0–32)
AST: 21 IU/L (ref 0–40)
Albumin/Globulin Ratio: 1.7 (ref 1.2–2.2)
Albumin: 4.3 g/dL (ref 3.8–4.8)
Alkaline Phosphatase: 69 IU/L (ref 44–121)
BUN/Creatinine Ratio: 15 (ref 12–28)
BUN: 10 mg/dL (ref 8–27)
Bilirubin Total: 0.4 mg/dL (ref 0.0–1.2)
CO2: 27 mmol/L (ref 20–29)
Calcium: 9.8 mg/dL (ref 8.7–10.3)
Chloride: 102 mmol/L (ref 96–106)
Creatinine, Ser: 0.65 mg/dL (ref 0.57–1.00)
Globulin, Total: 2.5 g/dL (ref 1.5–4.5)
Glucose: 100 mg/dL — ABNORMAL HIGH (ref 70–99)
Potassium: 4.2 mmol/L (ref 3.5–5.2)
Sodium: 140 mmol/L (ref 134–144)
Total Protein: 6.8 g/dL (ref 6.0–8.5)
eGFR: 92 mL/min/{1.73_m2} (ref >59)

## 2022-07-10 LAB — LIPID PANEL WITH LDL/HDL RATIO
Cholesterol, Total: 244 mg/dL — ABNORMAL HIGH (ref 100–199)
HDL: 62 mg/dL (ref >39)
LDL Chol Calc (NIH): 168 mg/dL — ABNORMAL HIGH (ref 0–99)
LDL/HDL Ratio: 2.7 ratio (ref 0.0–3.2)
Triglycerides: 82 mg/dL (ref 0–149)
VLDL Cholesterol Cal: 14 mg/dL (ref 5–40)

## 2022-07-10 LAB — TSH: TSH: 23.3 u[IU]/mL — ABNORMAL HIGH (ref 0.450–4.500)

## 2022-07-10 LAB — HEMOGLOBIN A1C
Est. average glucose Bld gHb Est-mCnc: 128 mg/dL
Hgb A1c MFr Bld: 6.1 % — ABNORMAL HIGH (ref 4.8–5.6)

## 2022-08-22 DIAGNOSIS — E039 Hypothyroidism, unspecified: Secondary | ICD-10-CM | POA: Diagnosis not present

## 2022-08-23 LAB — TSH: TSH: 0.17 u[IU]/mL — ABNORMAL LOW (ref 0.450–4.500)

## 2022-09-04 ENCOUNTER — Telehealth: Payer: Self-pay

## 2022-09-04 DIAGNOSIS — E039 Hypothyroidism, unspecified: Secondary | ICD-10-CM

## 2022-09-04 MED ORDER — LEVOTHYROXINE SODIUM 50 MCG PO TABS: 50.0000 ug | ORAL_TABLET | Freq: Every day | ORAL | 0 refills | Status: DC

## 2022-09-04 NOTE — Telephone Encounter (Signed)
-----   Message from Elliot Cousin, RN sent at 09/04/2022  3:36 PM EST ----- Result note read to pt, verbalizes understanding. Agreeable to dose change.

## 2022-09-05 ENCOUNTER — Telehealth: Payer: Self-pay

## 2022-09-05 NOTE — Telephone Encounter (Signed)
-----   Message from Virginia Crews, MD sent at 08/23/2022  8:17 AM EST ----- TSH is now low.  This means the levothyroxine dose is a little too high now that you ar taking it daily.  Recommend we decrease it to 50 mcg daily and recheck TSH in 2 months. CMAs- ok to order.

## 2022-09-11 ENCOUNTER — Other Ambulatory Visit: Payer: Self-pay | Admitting: Family Medicine

## 2022-09-11 DIAGNOSIS — E039 Hypothyroidism, unspecified: Secondary | ICD-10-CM

## 2022-09-12 ENCOUNTER — Other Ambulatory Visit: Payer: Self-pay | Admitting: Family Medicine

## 2022-09-12 DIAGNOSIS — E039 Hypothyroidism, unspecified: Secondary | ICD-10-CM

## 2022-09-13 ENCOUNTER — Other Ambulatory Visit: Payer: Self-pay | Admitting: Family Medicine

## 2022-09-13 DIAGNOSIS — E039 Hypothyroidism, unspecified: Secondary | ICD-10-CM

## 2022-09-14 ENCOUNTER — Telehealth: Payer: Self-pay

## 2022-09-14 NOTE — Telephone Encounter (Signed)
Copied from [place] [phone]. Topic: General - Other >> Sep 14, 2022  4:49 PM Cyndi Bender wrote: Reason for CRM: Pt returned call and requested that the Rx for levothyroxine (SYNTHROID) 50 MCG tablet be sent to [id number] Janet Craig, [address] [place] Phone: [phone]  Fax: [phone]

## 2022-09-14 NOTE — Telephone Encounter (Signed)
Requested medications are due for refill today.  unsure  Requested medications are on the active medications list.  yes  Last refill. 09/04/2022 #90 0 rf  - different pharmacy  Future visit scheduled.   yes  Notes to clinic.  Medication was recently refilled at a different pharmacy.  Abnormal labs.    Requested Prescriptions  Pending Prescriptions Disp Refills   levothyroxine (SYNTHROID) 50 MCG tablet [Pharmacy Med Name: LEVOTHYROXINE 0.'05MG'$  (50MCG) TAB] 90 tablet 0    Sig: TAKE 1 TABLET(50 MCG) BY MOUTH DAILY     Endocrinology:  Hypothyroid Agents Failed - 09/13/2022  7:43 PM      Failed - TSH in normal range and within 360 days    TSH  Date Value Ref Range Status  08/22/2022 0.170 (L) 0.450 - 4.500 uIU/mL Final         Passed - Valid encounter within last 12 months    Recent Outpatient Visits           2 months ago Encounter for annual physical exam   [facility], Dionne Bucy, MD   11 months ago [facility]ig[facility], Dionne Bucy, MD   1 year ago Encounter for annual wellness visit (AWV) in Medicare patient   [facility] [place], Dionne Bucy, MD   1 year ago Adult hypothyroidism   [facility] [place], Dionne Bucy, MD   2 years ago Urethral caruncle   [facility], Dionne Bucy, MD       Future Appointments             In 3 months Bacigalupo, Dionne Bucy, MD [facility], [place]

## 2022-12-03 ENCOUNTER — Other Ambulatory Visit: Payer: Self-pay | Admitting: Family Medicine

## 2022-12-03 DIAGNOSIS — E039 Hypothyroidism, unspecified: Secondary | ICD-10-CM

## 2022-12-04 NOTE — Telephone Encounter (Signed)
Requested Prescriptions  Pending Prescriptions Disp Refills   levothyroxine (SYNTHROID) 50 MCG tablet [Pharmacy Med Name: LEVOTHYROXINE 0.05MG  (50MCG) TAB] 90 tablet 0    Sig: TAKE 1 TABLET(50 MCG) BY MOUTH DAILY     Endocrinology:  Hypothyroid Agents Failed - 12/03/2022  9:19 AM      Failed - TSH in normal range and within 360 days    TSH  Date Value Ref Range Status  08/22/2022 0.170 (L) 0.450 - 4.500 uIU/mL Final         Passed - Valid encounter within last 12 months    Recent Outpatient Visits           4 months ago Encounter for annual physical exam   [facility] [place], Dionne Bucy, MD   1 year ago Prediabetes   [place], Dionne Bucy, MD   1 year ago Encounter for annual wellness visit (AWV) in Medicare patient   [facility], Dionne Bucy, MD   2 years ago Adult hypothyroidism   [place], Dionne Bucy, MD   2 years ago Urethral caruncle   [place], Dionne Bucy, MD       Future Appointments             In 1 month Bacigalupo, Dionne Bucy, MD [facility]

## 2023-01-02 NOTE — Telephone Encounter (Signed)
Contacted patient to reschedule office visit.

## 2023-01-09 NOTE — Progress Notes (Deleted)
I,Xavi Tomasik S Ying Blankenhorn,acting as a Neurosurgeon for Shirlee Latch, MD.,have documented all relevant documentation on the behalf of Shirlee Latch, MD,as directed by  Shirlee Latch, MD while in the presence of Shirlee Latch, MD.     Established patient visit   Patient: Janet Craig   DOB: 28-Jul-1946   77 y.o. Female  MRN: [id number] Visit Date: 01/10/2023  Today's healthcare provider: Shirlee Latch, MD   No chief complaint on file.  Subjective    HPI  Hypothyroid, follow-up  Lab Results  Component Value Date   TSH 0.170 (L) 08/22/2022   TSH 23.300 (H) 07/09/2022   TSH 8.810 (H) 10/09/2021   T4TOTAL 5.4 04/19/2017    Wt Readings from Last 3 Encounters:  07/09/22 119 lb 14.4 oz (54.4 kg)  05/29/22 117 lb 8 oz (53.3 kg)  10/09/21 125 lb (56.7 kg)    She was last seen for hypothyroid 6 months ago.  Management since that visit includes decrease levothyroxine to 50 mcg daily. Advised to check tsh in 2 mths. She reports {excellent/good/fair/poor:19665} compliance with treatment. She {is/is not:21021397} having side effects. {document side effects if present:1}  ----------------------------------------------------------------------------------------- Lipid/Cholesterol, Follow-up  Last lipid panel Other pertinent labs  Lab Results  Component Value Date   CHOL 244 (H) 07/09/2022   HDL 62 07/09/2022   LDLCALC 168 (H) 07/09/2022   TRIG 82 07/09/2022   CHOLHDL 3.5 04/07/2021   Lab Results  Component Value Date   ALT 21 07/09/2022   AST 21 07/09/2022   PLT 336 07/25/2018   TSH 0.170 (L) 08/22/2022     She was last seen for this 6 months ago.  Management since that visit includes no changes.  She reports {excellent/good/fair/poor:19665} compliance with treatment. She {is/is not:9024} having side effects. {document side effects if present:1}  The 10-year ASCVD risk score (Arnett DK, et al., 2019) is:  14.1%  --------------------------------------------------------------------------------------------------- Prediabetes, Follow-up  Lab Results  Component Value Date   HGBA1C 6.1 (H) 07/09/2022   HGBA1C 6.0 (H) 10/09/2021   HGBA1C 5.9 (H) 04/07/2021   GLUCOSE 100 (H) 07/09/2022   GLUCOSE 98 04/07/2021   GLUCOSE 72 09/19/2020    Last seen for for this6 months ago.  Management since that visit includes no changes. Current symptoms include {Symptoms; diabetes:14075} and have been {Desc; course:15616}.  Prior visit with dietician: {yes/no:17258} Current diet: {diet habits:16563} Current exercise: {exercise types:16438}  Pertinent Labs:    Component Value Date/Time   CHOL 244 (H) 07/09/2022 0845   TRIG 82 07/09/2022 0845   CHOLHDL 3.5 04/07/2021 0735   CREATININE 0.65 07/09/2022 0845   CREATININE 0.49 (L) 02/25/2013 0302    Wt Readings from Last 3 Encounters:  07/09/22 119 lb 14.4 oz (54.4 kg)  05/29/22 117 lb 8 oz (53.3 kg)  10/09/21 125 lb (56.7 kg)    -----------------------------------------------------------------------------------------   Medications: Outpatient Medications Prior to Visit  Medication Sig   levothyroxine (SYNTHROID) 50 MCG tablet TAKE 1 TABLET(50 MCG) BY MOUTH DAILY   Multiple Vitamin tablet Take 1 tablet by mouth daily.    Omega-3 Fatty Acids (FISH OIL MAXIMUM STRENGTH) 1200 MG CPDR Take 1,200 mg by mouth daily.    No facility-administered medications prior to visit.    Review of Systems  {Labs  Heme  Chem  Endocrine  Serology  Results Review (optional):23779}   Objective    There were no vitals taken for this visit. {Show previous vital signs (optional):23777}  Physical Exam  ***  No results found for any  visits on 01/10/23.  Assessment & Plan     ***  No follow-ups on file.      {provider attestation***:1}   Shirlee Latch, MD  [facility] [phone] (phone) [phone] (fax)  [facility]

## 2023-01-10 ENCOUNTER — Ambulatory Visit: Payer: Medicare PPO | Admitting: Family Medicine

## 2023-01-17 ENCOUNTER — Ambulatory Visit: Payer: Medicare PPO | Admitting: Family Medicine

## 2023-01-17 ENCOUNTER — Encounter: Payer: Self-pay | Admitting: Family Medicine

## 2023-01-17 VITALS — BP 94/60 | HR 66 | Temp 97.8°F | Resp 12 | Ht 64.5 in | Wt 118.9 lb

## 2023-01-17 DIAGNOSIS — R7303 Prediabetes: Secondary | ICD-10-CM

## 2023-01-17 DIAGNOSIS — E785 Hyperlipidemia, unspecified: Secondary | ICD-10-CM

## 2023-01-17 DIAGNOSIS — E039 Hypothyroidism, unspecified: Secondary | ICD-10-CM

## 2023-01-17 NOTE — Progress Notes (Signed)
I,Sulibeya S Dimas,acting as a Neurosurgeon for Shirlee Latch, MD.,have documented all relevant documentation on the behalf of Shirlee Latch, MD,as directed by  Shirlee Latch, MD while in the presence of Shirlee Latch, MD.    Established patient visit   Patient: Janet Craig   DOB: March 12, 1946   77 y.o. Female  MRN: [id number] Visit Date: 01/17/2023  Today's healthcare provider: Shirlee Latch, MD   Chief Complaint  Patient presents with   Hypothyroidism   Hyperlipidemia   Subjective    HPI  Hypothyroid, follow-up  Lab Results  Component Value Date   TSH 0.170 (L) 08/22/2022   TSH 23.300 (H) 07/09/2022   TSH 8.810 (H) 10/09/2021   T4TOTAL 5.4 04/19/2017    Wt Readings from Last 3 Encounters:  01/17/23 118 lb 14.4 oz (53.9 kg)  07/09/22 119 lb 14.4 oz (54.4 kg)  05/29/22 117 lb 8 oz (53.3 kg)    She was last seen for hypothyroid 6 months ago.  Management since that visit includes decrease levothyroxine to 50 mcg daily. She reports excellent compliance with treatment. She is not having side effects.   ----------------------------------------------------------------------------------------- Lipid/Cholesterol, Follow-up  Last lipid panel Other pertinent labs  Lab Results  Component Value Date   CHOL 244 (H) 07/09/2022   HDL 62 07/09/2022   LDLCALC 168 (H) 07/09/2022   TRIG 82 07/09/2022   CHOLHDL 3.5 04/07/2021   Lab Results  Component Value Date   ALT 21 07/09/2022   AST 21 07/09/2022   PLT 336 07/25/2018   TSH 0.170 (L) 08/22/2022     She was last seen for this 6 months ago.  Management since that visit includes recommending to start crestor 5 mg daily. Patient declined to start medication.    The 10-year ASCVD risk score (Arnett DK, et al., 2019) is: 9.3%  --------------------------------------------------------------------------------------------------- Prediabetes, Follow-up  Lab Results  Component Value Date   HGBA1C 6.1 (H)  07/09/2022   HGBA1C 6.0 (H) 10/09/2021   HGBA1C 5.9 (H) 04/07/2021   GLUCOSE 100 (H) 07/09/2022   GLUCOSE 98 04/07/2021   GLUCOSE 72 09/19/2020    Last seen for for this6 months ago.  Management since that visit includes no changes. Current symptoms include none and have been stable.   Pertinent Labs:    Component Value Date/Time   CHOL 244 (H) 07/09/2022 0845   TRIG 82 07/09/2022 0845   CHOLHDL 3.5 04/07/2021 0735   CREATININE 0.65 07/09/2022 0845   CREATININE 0.49 (L) 02/25/2013 0302    Wt Readings from Last 3 Encounters:  01/17/23 118 lb 14.4 oz (53.9 kg)  07/09/22 119 lb 14.4 oz (54.4 kg)  05/29/22 117 lb 8 oz (53.3 kg)    -----------------------------------------------------------------------------------------   Medications: Outpatient Medications Prior to Visit  Medication Sig   levothyroxine (SYNTHROID) 50 MCG tablet TAKE 1 TABLET(50 MCG) BY MOUTH DAILY   Multiple Vitamin tablet Take 1 tablet by mouth daily.    Omega-3 Fatty Acids (FISH OIL MAXIMUM STRENGTH) 1200 MG CPDR Take 1,200 mg by mouth daily.    No facility-administered medications prior to visit.    Review of Systems  Constitutional:  Negative for appetite change, chills, diaphoresis, fatigue and unexpected weight change.  Cardiovascular:  Negative for chest pain, palpitations and leg swelling.  Gastrointestinal:  Negative for abdominal pain, constipation, diarrhea, nausea and vomiting.  Endocrine: Negative for cold intolerance and heat intolerance.       Objective    BP 94/60 (BP Location: Left Arm, Patient Position:  Sitting, Cuff Size: Normal)   Pulse 66   Temp 97.8 F (36.6 C) (Temporal)   Resp 12   Ht 5' 4.5" (1.638 m)   Wt 118 lb 14.4 oz (53.9 kg)   SpO2 97%   BMI 20.09 kg/m  BP Readings from Last 3 Encounters:  01/17/23 94/60  07/09/22 118/73  05/29/22 102/60   Wt Readings from Last 3 Encounters:  01/17/23 118 lb 14.4 oz (53.9 kg)  07/09/22 119 lb 14.4 oz (54.4 kg)  05/29/22  117 lb 8 oz (53.3 kg)   Physical Exam Vitals reviewed.  Constitutional:      General: She is not in acute distress.    Appearance: Normal appearance. She is well-developed. She is not diaphoretic.  HENT:     Head: Normocephalic and atraumatic.  Eyes:     General: No scleral icterus.    Conjunctiva/sclera: Conjunctivae normal.  Neck:     Thyroid: No thyromegaly.  Cardiovascular:     Rate and Rhythm: Normal rate and regular rhythm.     Heart sounds: Normal heart sounds. No murmur heard. Pulmonary:     Effort: Pulmonary effort is normal. No respiratory distress.     Breath sounds: Normal breath sounds. No wheezing, rhonchi or rales.  Musculoskeletal:     Cervical back: Neck supple.     Right lower leg: No edema.     Left lower leg: No edema.  Lymphadenopathy:     Cervical: No cervical adenopathy.  Skin:    General: Skin is warm and dry.     Findings: No rash.  Neurological:     Mental Status: She is alert and oriented to person, place, and time. Mental status is at baseline.  Psychiatric:        Mood and Affect: Mood normal.        Behavior: Behavior normal.      Problem List Items Addressed This Visit       Endocrine   Adult hypothyroidism - Primary    Last TSH low - synthroid adjusted Continue Synthroid at current dose  Recheck TSH and adjust Synthroid as indicated        Relevant Orders   TSH     Other   Prediabetes    Recommend low carb diet Recheck A1c       Relevant Orders   Hemoglobin A1c   HLD (hyperlipidemia)    Reviewed last lipid panel Not currently on a statin Recheck FLP and CMP Discussed diet and exercise       Relevant Orders   Lipid panel   Comprehensive metabolic panel     Return in about 6 months (around 07/20/2023) for CPE.      I, Shirlee Latch, MD, have reviewed all documentation for this visit. The documentation on 01/18/23 for the exam, diagnosis, procedures, and orders are all accurate and complete.   Jaki Steptoe,  Marzella Schlein, MD, MPH [facility]

## 2023-01-17 NOTE — Assessment & Plan Note (Addendum)
Last TSH low - synthroid adjusted Continue Synthroid at current dose  Recheck TSH and adjust Synthroid as indicated

## 2023-01-18 DIAGNOSIS — E039 Hypothyroidism, unspecified: Secondary | ICD-10-CM | POA: Diagnosis not present

## 2023-01-18 DIAGNOSIS — E785 Hyperlipidemia, unspecified: Secondary | ICD-10-CM | POA: Diagnosis not present

## 2023-01-18 DIAGNOSIS — R7303 Prediabetes: Secondary | ICD-10-CM | POA: Diagnosis not present

## 2023-01-18 NOTE — Assessment & Plan Note (Signed)
Recommend low carb diet °Recheck A1c  °

## 2023-01-18 NOTE — Assessment & Plan Note (Signed)
Reviewed last lipid panel Not currently on a statin Recheck FLP and CMP Discussed diet and exercise  

## 2023-01-19 LAB — COMPREHENSIVE METABOLIC PANEL
ALT: 14 IU/L (ref 0–32)
AST: 18 IU/L (ref 0–40)
Albumin/Globulin Ratio: 2 (ref 1.2–2.2)
Albumin: 4.5 g/dL (ref 3.8–4.8)
Alkaline Phosphatase: 66 IU/L (ref 44–121)
BUN/Creatinine Ratio: 18 (ref 12–28)
BUN: 11 mg/dL (ref 8–27)
Bilirubin Total: 0.7 mg/dL (ref 0.0–1.2)
CO2: 23 mmol/L (ref 20–29)
Calcium: 9.5 mg/dL (ref 8.7–10.3)
Chloride: 99 mmol/L (ref 96–106)
Creatinine, Ser: 0.6 mg/dL (ref 0.57–1.00)
Globulin, Total: 2.3 g/dL (ref 1.5–4.5)
Glucose: 99 mg/dL (ref 70–99)
Potassium: 4.2 mmol/L (ref 3.5–5.2)
Sodium: 139 mmol/L (ref 134–144)
Total Protein: 6.8 g/dL (ref 6.0–8.5)
eGFR: 94 mL/min/{1.73_m2} (ref >59)

## 2023-01-19 LAB — LIPID PANEL
Chol/HDL Ratio: 4 ratio (ref 0.0–4.4)
Cholesterol, Total: 202 mg/dL — ABNORMAL HIGH (ref 100–199)
HDL: 50 mg/dL (ref >39)
LDL Chol Calc (NIH): 140 mg/dL — ABNORMAL HIGH (ref 0–99)
Triglycerides: 65 mg/dL (ref 0–149)
VLDL Cholesterol Cal: 12 mg/dL (ref 5–40)

## 2023-01-19 LAB — HEMOGLOBIN A1C
Est. average glucose Bld gHb Est-mCnc: 126 mg/dL
Hgb A1c MFr Bld: 6 % — ABNORMAL HIGH (ref 4.8–5.6)

## 2023-01-19 LAB — TSH: TSH: 3.38 u[IU]/mL (ref 0.450–4.500)

## 2023-03-13 ENCOUNTER — Other Ambulatory Visit: Payer: Self-pay | Admitting: Family Medicine

## 2023-03-13 DIAGNOSIS — E039 Hypothyroidism, unspecified: Secondary | ICD-10-CM

## 2023-03-13 NOTE — Telephone Encounter (Signed)
Requested Prescriptions  Pending Prescriptions Disp Refills   levothyroxine (SYNTHROID) 50 MCG tablet [Pharmacy Med Name: LEVOTHYROXINE 0.05MG  ( ) TAB] 90 tablet 0    Sig: TAKE 1 TABLET(50 MCG) BY MOUTH DAILY     Endocrinology:  Hypothyroid Agents Passed - 03/13/2023 10:21 AM      Passed - TSH in normal range and within 360 days    TSH  Date Value Ref Range Status  01/18/2023 3.380 0.450 - 4.500 uIU/mL Final         Passed - Valid encounter within last 12 months    Recent Outpatient Visits           1 month ago Adult hypothyroidism   [place] [facility] [place], Marzella Schlein, MD   8 months ago Encounter for annual physical exam   [facility] [place], Marzella Schlein, MD   1 year ago Prediabetes   [place] [facility] [place], Marzella Schlein, MD   1 year ago Encounter for annual wellness visit (AWV) in Medicare patient   [facility] [place], Marzella Schlein, MD   2 years ago Adult hypothyroidism   [facility] [place], Marzella Schlein, MD       Future Appointments             In 4 months Bacigalupo, Marzella Schlein, MD [facility]

## 2023-06-03 ENCOUNTER — Ambulatory Visit (INDEPENDENT_AMBULATORY_CARE_PROVIDER_SITE_OTHER): Payer: Medicare PPO

## 2023-06-03 VITALS — BP 108/71 | HR 65 | Ht 64.5 in | Wt 122.9 lb

## 2023-06-03 DIAGNOSIS — Z0000 Encounter for general adult medical examination without abnormal findings: Secondary | ICD-10-CM

## 2023-06-03 DIAGNOSIS — Z13820 Encounter for screening for osteoporosis: Secondary | ICD-10-CM

## 2023-06-03 NOTE — Patient Instructions (Signed)
Janet Craig , Thank you for taking time to come for your Medicare Wellness Visit. I appreciate your ongoing commitment to your health goals. Please review the following plan we discussed and let me know if I can assist you in the future.   Referrals/Orders/Follow-Ups/Clinician Recommendations:  You have an order for:  []   2D Mammogram  []   3D Mammogram  [x]   Bone Density     Please call for appointment:  [facility]  [address] [place] [address] [phone]  [facility] [address] [place], [place] [address] [phone]  [facility] [address] Geanie Logan [place], [place] [address] [phone]    Make sure to wear two-piece clothing.  No lotions, powders, or deodorants the day of the appointment. Make sure to bring picture ID and insurance card.  Bring list of medications you are currently taking including any supplements.   Schedule your  screening mammogram through MyChart!   Log into your MyChart account.  Go to 'Visit' (or 'Appointments' if on mobile App) --> Schedule an Appointment  Under 'Select a Reason for Visit' choose the Mammogram Screening option.  Complete the pre-visit questions and select the time and place that best fits your schedule.    This is a list of the screening recommended for you and due dates:  Health Maintenance  Topic Date Due   DTaP/Tdap/Td vaccine (1 - Tdap) Never done   DEXA scan (bone density measurement)  04/29/2023   COVID-19 Vaccine (4 - 2023-24 season) 06/19/2023*   Zoster (Shingles) Vaccine (1 of 2) 09/02/2023*   Flu Shot  12/09/2023*   Medicare Annual Wellness Visit  06/02/2024   Pneumonia Vaccine  Completed   Hepatitis C Screening  Completed   HPV Vaccine  Aged Out   Colon Cancer Screening  Discontinued  *Topic was postponed. The date shown is not the original due date.     Advanced directives: (Copy Requested) Please bring a copy of your health care power of attorney and living will to the office to be added to your chart at your convenience.  Next Medicare Annual Wellness Visit scheduled for next year:   06/03/2024 @ 9:15am in person

## 2023-06-03 NOTE — Progress Notes (Signed)
Subjective:   Janet Craig is a 77 y.o. female who presents for Medicare Annual (Subsequent) preventive examination.  Visit Complete: In person  Patient Medicare AWV questionnaire was completed by the patient on (not done); I have confirmed that all information answered by patient is correct and no changes since this date.  Cardiac Risk Factors include: advanced age (>78men, >52 women);dyslipidemia    Objective:    Today's Vitals   06/03/23 0916  BP: 108/71  Pulse: 65  Weight: 122 lb 14.4 oz (55.7 kg)  Height: 5' 4.5" (1.638 m)   Body mass index is 20.77 kg/m.     06/03/2023    9:26 AM 05/29/2022    9:36 AM 10/19/2019    2:16 PM 03/31/2018    1:51 PM 03/29/2017   11:11 AM  Advanced Directives  Does Patient Have a Medical Advance Directive? Yes No Yes Yes Yes  Type of Estate agent of [place];Living will  Healthcare Power of [place];Living will Healthcare Power of [place];Living will Healthcare Power of [place];Living will  Copy of Healthcare Power of Attorney in Chart?   No - copy requested No - copy requested No - copy requested  Would patient like information on creating a medical advance directive?  No - Patient declined       Current Medications (verified) Outpatient Encounter Medications as of 06/03/2023  Medication Sig   levothyroxine (SYNTHROID) 50 MCG tablet TAKE 1 TABLET(50 MCG) BY MOUTH DAILY   Multiple Vitamin tablet Take 1 tablet by mouth daily.    Omega-3 Fatty Acids (FISH OIL MAXIMUM STRENGTH) 1200 MG CPDR Take 1,200 mg by mouth daily.    No facility-administered encounter medications on file as of 06/03/2023.    Allergies (verified) Neomycin-bacitracin zn-polymyx, Penicillins, and Miconazole nitrate   History: Past Medical History:  Diagnosis Date   Hyperlipidemia    Past Surgical History:  Procedure Laterality Date   ABDOMINAL HYSTERECTOMY     APPENDECTOMY     COLONOSCOPY  12/2005   EYE SURGERY     LUMBAR  MICRODISCECTOMY  11/2010   TOTAL HIP ARTHROPLASTY Left 02/24/13   Family History  Problem Relation Age of Onset   Breast cancer Mother 29   Lung cancer Mother    CVA Father    Lung cancer Father    Melanoma Father    Multiple sclerosis Sister    Diabetes Sister    Atrial fibrillation Sister    Lupus Brother    Colon cancer Paternal Aunt    Lung cancer Maternal Grandfather    Social History   Socioeconomic History   Marital status: Married    Spouse name: Not on file   Number of children: 1   Years of education: Not on file   Highest education level: Master's degree (e.g., MA, MS, MEng, MEd, MSW, MBA)  Occupational History   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  Tobacco Use   Smoking status: Never   Smokeless tobacco: Never  Vaping Use   Vaping status: Never Used  Substance and Sexual Activity   Alcohol use: No   Drug use: No   Sexual activity: Yes  Other Topics Concern   Not on file  Social History Narrative   Not on file   Social Determinants of Health   Financial Resource Strain: Low Risk  (06/03/2023)   Overall Financial Resource Strain (CARDIA)    Difficulty of Paying Living Expenses: Not hard at all  Food Insecurity: No Food Insecurity (06/03/2023)  Hunger Vital Sign    Worried About Running Out of Food in the Last Year: Never true    Ran Out of Food in the Last Year: Never true  Transportation Needs: No Transportation Needs (06/03/2023)   PRAPARE - Administrator, Civil Service (Medical): No    Lack of Transportation (Non-Medical): No  Physical Activity: Sufficiently Active (06/03/2023)   Exercise Vital Sign    Days of Exercise per Week: 5 days    Minutes of Exercise per Session: 60 min  Stress: No Stress Concern Present (06/03/2023)   [employer] of Occupational Health - Occupational Stress Questionnaire    Feeling of Stress : Not at all  Social Connections: Moderately Integrated (06/03/2023)   Social Connection and Isolation  Panel [NHANES]    Frequency of Communication with Friends and Family: More than three times a week    Frequency of Social Gatherings with Friends and Family: Once a week    Attends Religious Services: More than 4 times per year    Active Member of [employer] or Organizations: No    Attends Engineer, structural: Never    Marital Status: Married    Tobacco Counseling Counseling given: Not Answered   Clinical Intake:  Pre-visit preparation completed: Yes  Pain : No/denies pain     BMI - recorded: 20.77 Nutritional Status: BMI of 19-24  Normal Nutritional Risks: None Diabetes: No  How often do you need to have someone help you when you read instructions, pamphlets, or other written materials from your doctor or pharmacy?: 1 - Never  Interpreter Needed?: No  Comments: lives with husband Information entered by :: B.Rhodes Calvert,LPN   Activities of Daily Living    06/03/2023    9:27 AM 01/17/2023    2:35 PM  In your present state of health, do you have any difficulty performing the following activities:  Hearing? 0 0  Vision? 0 0  Difficulty concentrating or making decisions? 0 0  Walking or climbing stairs? 0 0  Dressing or bathing? 0 0  Doing errands, shopping? 0 0  Preparing Food and eating ? N   Using the Toilet? N   In the past six months, have you accidently leaked urine? N   Do you have problems with loss of bowel control? N   Managing your Medications? N   Managing your Finances? N   Housekeeping or managing your Housekeeping? N     Patient Care Team: Erasmo Downer, MD as PCP - General (Family Medicine) Galen Manila, MD as Referring Physician (Ophthalmology) Bosie Clos, MD (Family Medicine)  Indicate any recent Medical Services you may have received from other than [facility] providers in the past year (date may be approximate).     Assessment:   This is a routine wellness examination for Janet Craig.  Hearing/Vision screen Hearing Screening  - Comments:: Pt says no hearing problems Vision Screening - Comments:: Pt says she has had cataract surgery in both eyes and vision very good now Dr Druscilla Brownie   Goals Addressed             This Visit's Progress    COMPLETED: DIET - EAT MORE FRUITS AND VEGETABLES   On track    COMPLETED: Exercise 3x per week (30 min per time)   On track    Recommend to exercise for 3 days a week for at least 30 minutes at a time.        Depression Screen    06/03/2023  9:21 AM 01/17/2023    2:35 PM 07/09/2022    8:19 AM 05/29/2022    9:34 AM 10/09/2021    9:29 AM 04/06/2021   10:19 AM 10/19/2019    2:12 PM  PHQ 2/9 Scores  PHQ - 2 Score 0 0 0 0 0 0 0  PHQ- 9 Score  0 0 0  0     Fall Risk    06/03/2023    9:19 AM 01/17/2023    2:35 PM 07/09/2022    8:19 AM 05/29/2022    9:37 AM 10/09/2021    9:29 AM  Fall Risk   Falls in the past year? 0 0 0 0 0  Number falls in past yr: 0 0 0 0 0  Injury with Fall? 0 0 0 0 0  Risk for fall due to : No Fall Risks No Fall Risks No Fall Risks No Fall Risks   Follow up Falls prevention discussed;Education provided Falls evaluation completed Falls evaluation completed Falls prevention discussed     MEDICARE RISK AT HOME: Medicare Risk at Home Any stairs in or around the home?: Yes If so, are there any without handrails?: Yes Home free of loose throw rugs in walkways, pet beds, electrical cords, etc?: Yes Adequate lighting in your home to reduce risk of falls?: Yes Life alert?: No Use of a cane, walker or w/c?: No Grab bars in the bathroom?: No Shower chair or bench in shower?: No Elevated toilet seat or a handicapped toilet?: No  TIMED UP AND GO:  Was the test performed?  Yes  Length of time to ambulate 10 feet: 8 sec Gait steady and fast without use of assistive device    Cognitive Function:        06/03/2023    9:36 AM 05/29/2022    9:44 AM 10/19/2019    2:19 PM 03/29/2017   11:14 AM  6CIT Screen  What Year? 0 points 0 points 0 points 0 points   What month? 0 points 0 points 0 points 0 points  What time? 0 points 0 points 0 points 0 points  Count back from 20 0 points 0 points 0 points 0 points  Months in reverse 0 points 0 points 0 points 0 points  Repeat phrase 0 points 2 points 0 points 0 points  Total Score 0 points 2 points 0 points 0 points    Immunizations Immunization History  Administered Date(s) Administered   Fluad Quad(high Dose 65+) 07/14/2019   Influenza, High Dose Seasonal PF 07/09/2017, 10/03/2018   PFIZER Comirnaty(Gray Top)Covid-19 Tri-Sucrose Vaccine 12/07/2020   PFIZER(Purple Top)SARS-COV-2 Vaccination 11/06/2019, 12/02/2019   Pneumococcal Conjugate-13 10/20/2014   Pneumococcal Polysaccharide-23 10/25/2015    TDAP status: Up to date  Flu Vaccine status: Due, Education has been provided regarding the importance of this vaccine. Advised may receive this vaccine at local pharmacy or Health Dept. Aware to provide a copy of the vaccination record if obtained from local pharmacy or Health Dept. Verbalized acceptance and understanding.  Pneumococcal vaccine status: Up to date  Covid-19 vaccine status: Completed vaccines  Qualifies for Shingles Vaccine? Yes   Zostavax completed No   Shingrix Completed?: No.    Education has been provided regarding the importance of this vaccine. Patient has been advised to call insurance company to determine out of pocket expense if they have not yet received this vaccine. Advised may also receive vaccine at local pharmacy or Health Dept. Verbalized acceptance and understanding.  Screening Tests Health Maintenance  Topic  Date Due   DTaP/Tdap/Td (1 - Tdap) Never done   DEXA SCAN  04/29/2023   COVID-19 Vaccine (4 - 2023-24 season) 06/19/2023 (Originally 05/12/2023)   Zoster Vaccines- Shingrix (1 of 2) 09/02/2023 (Originally 02/17/1997)   INFLUENZA VACCINE  12/09/2023 (Originally 04/11/2023)   Medicare Annual Wellness (AWV)  06/02/2024   Pneumonia Vaccine 57+ Years old   Completed   Hepatitis C Screening  Completed   HPV VACCINES  Aged Out   Colonoscopy  Discontinued    Health Maintenance  Health Maintenance Due  Topic Date Due   DTaP/Tdap/Td (1 - Tdap) Never done   DEXA SCAN  04/29/2023    Colorectal cancer screening: No longer required.   Mammogram status: No longer required due to age.  Bone Density status: Ordered yes. Pt provided with contact info and advised to call to schedule appt.  Lung Cancer Screening: (Low Dose CT Chest recommended if Age 72-80 years, 20 pack-year currently smoking OR have quit w/in 15years.) does not qualify.   Lung Cancer Screening Referral: yes  Additional Screening:  Hepatitis C Screening: does not qualify; Completed yes  Vision Screening: Recommended annual ophthalmology exams for early detection of glaucoma and other disorders of the eye. Is the patient up to date with their annual eye exam?  Yes  Who is the provider or what is the name of the office in which the patient attends annual eye exams? Dr Druscilla Brownie If pt is not established with a provider, would they like to be referred to a provider to establish care? No .   Dental Screening: Recommended annual dental exams for proper oral hygiene  Diabetic Foot Exam: n/a  Community Resource Referral / Chronic Care Management: CRR required this visit?  No   CCM required this visit?  No    Plan:     I have personally reviewed and noted the following in the patient's chart:   Medical and social history Use of alcohol, tobacco or illicit drugs  Current medications and supplements including opioid prescriptions. Patient is not currently taking opioid prescriptions. Functional ability and status Nutritional status Physical activity Advanced directives List of other physicians Hospitalizations, surgeries, and ER visits in previous 12 months Vitals Screenings to include cognitive, depression, and falls Referrals and appointments  In addition, I have  reviewed and discussed with patient certain preventive protocols, quality metrics, and best practice recommendations. A written personalized care plan for preventive services as well as general preventive health recommendations were provided to patient.    Sue Lush, LPN   [id number]   After Visit Summary: (MyChart) Due to this being a telephonic visit, the after visit summary with patients personalized plan was offered to patient via MyChart   Nurse Notes: The patient states she is doing well and has no concerns or questions at this time.  *Bone Density order placed

## 2023-06-11 ENCOUNTER — Other Ambulatory Visit: Payer: Self-pay | Admitting: Family Medicine

## 2023-06-11 DIAGNOSIS — E039 Hypothyroidism, unspecified: Secondary | ICD-10-CM

## 2023-06-12 NOTE — Telephone Encounter (Signed)
Requested Prescriptions  Pending Prescriptions Disp Refills   levothyroxine (SYNTHROID) 50 MCG tablet [Pharmacy Med Name: LEVOTHYROXINE 0.05MG  ( ) TAB] 90 tablet 0    Sig: TAKE 1 TABLET(50 MCG) BY MOUTH DAILY     Endocrinology:  Hypothyroid Agents Passed - 06/11/2023  2:36 PM      Passed - TSH in normal range and within 360 days    TSH  Date Value Ref Range Status  01/18/2023 3.380 0.450 - 4.500 uIU/mL Final         Passed - Valid encounter within last 12 months    Recent Outpatient Visits           4 months ago Adult hypothyroidism   [place] [facility] [place], Marzella Schlein, MD   11 months ago Encounter for annual physical exam   [facility] [place], Marzella Schlein, MD   1 year ago Prediabetes   [place] [place], Marzella Schlein, MD   2 years ago Encounter for annual wellness visit (AWV) in Medicare patient   [facility] [place], Marzella Schlein, MD   2 years ago Adult hypothyroidism    [facility] [place], Marzella Schlein, MD       Future Appointments             In 1 month Bacigalupo, Marzella Schlein, MD [facility]

## 2023-07-15 ENCOUNTER — Other Ambulatory Visit: Payer: Self-pay | Admitting: Family Medicine

## 2023-07-15 DIAGNOSIS — Z1231 Encounter for screening mammogram for malignant neoplasm of breast: Secondary | ICD-10-CM

## 2023-07-19 NOTE — Patient Instructions (Incomplete)
Patient is due for Tdap and Covid vaccines

## 2023-07-23 ENCOUNTER — Encounter: Payer: Self-pay | Admitting: Family Medicine

## 2023-07-23 ENCOUNTER — Ambulatory Visit (INDEPENDENT_AMBULATORY_CARE_PROVIDER_SITE_OTHER): Payer: Medicare PPO | Admitting: Family Medicine

## 2023-07-23 VITALS — BP 129/69 | HR 56 | Ht 64.0 in | Wt 121.0 lb

## 2023-07-23 DIAGNOSIS — Z0000 Encounter for general adult medical examination without abnormal findings: Secondary | ICD-10-CM | POA: Diagnosis not present

## 2023-07-23 DIAGNOSIS — Z780 Asymptomatic menopausal state: Secondary | ICD-10-CM

## 2023-07-23 DIAGNOSIS — E785 Hyperlipidemia, unspecified: Secondary | ICD-10-CM | POA: Diagnosis not present

## 2023-07-23 DIAGNOSIS — R7303 Prediabetes: Secondary | ICD-10-CM

## 2023-07-23 DIAGNOSIS — Z13820 Encounter for screening for osteoporosis: Secondary | ICD-10-CM

## 2023-07-23 DIAGNOSIS — Z23 Encounter for immunization: Secondary | ICD-10-CM

## 2023-07-23 DIAGNOSIS — E039 Hypothyroidism, unspecified: Secondary | ICD-10-CM

## 2023-07-23 MED ORDER — LEVOTHYROXINE SODIUM 50 MCG PO TABS: 50.0000 ug | ORAL_TABLET | Freq: Every day | ORAL | 3 refills | Status: DC

## 2023-07-23 NOTE — Progress Notes (Signed)
Complete physical exam  Patient: Janet Craig   DOB: 03-22-1946   77 y.o. Female  MRN: [id number]  Subjective:    Chief Complaint  Patient presents with   Annual Exam    Last completed 07/09/22. Patient reports consuming a well balanced diet and participates in exercise daily by walking or doing yard work. She reports feeling well and sleeping well. No concerns to report    Janet Craig is a 77 y.o. female who presents today for a complete physical exam.   Discussed the use of AI scribe software for clinical note transcription with the patient, who gave verbal consent to proceed.  History of Present Illness   The patient presents for an annual physical. She reports no new concerns or symptoms. She has already received her flu shot. She has questions about the shingles vaccine, as her husband has never had chickenpox. She is unsure of the date of her last tetanus shot, but believes it may have been 10-15 years ago. She takes Synthroid 50 micrograms daily for thyroid management and reports taking it in the middle of the night to avoid potential conflicts with other medications or food.       Most recent fall risk assessment:    06/03/2023    9:19 AM  Fall Risk   Falls in the past year? 0  Number falls in past yr: 0  Injury with Fall? 0  Risk for fall due to : No Fall Risks  Follow up Falls prevention discussed;Education provided     Most recent depression screenings:    06/03/2023    9:21 AM 01/17/2023    2:35 PM  PHQ 2/9 Scores  PHQ - 2 Score 0 0  PHQ- 9 Score  0        Patient Care Team: Erasmo Downer, MD as PCP - General (Family Medicine) Galen Manila, MD as Referring Physician (Ophthalmology) Bosie Clos, MD (Family Medicine)   Outpatient Medications Prior to Visit  Medication Sig   Multiple Vitamin tablet Take 1 tablet by mouth daily.    Omega-3 Fatty Acids (FISH OIL MAXIMUM STRENGTH) 1200 MG CPDR Take 1,200 mg by mouth daily.     [DISCONTINUED] levothyroxine (SYNTHROID) 50 MCG tablet TAKE 1 TABLET(50 MCG) BY MOUTH DAILY   No facility-administered medications prior to visit.    ROS        Objective:     BP 129/69 (BP Location: Left Arm, Patient Position: Sitting, Cuff Size: Normal)   Pulse (!) 56   Ht 5\' 4"  (1.626 m)   Wt 121 lb (54.9 kg)   SpO2 100%   BMI 20.77 kg/m    Physical Exam Vitals reviewed.  Constitutional:      General: She is not in acute distress.    Appearance: Normal appearance. She is well-developed. She is not diaphoretic.  HENT:     Head: Normocephalic and atraumatic.     Right Ear: Tympanic membrane, ear canal and external ear normal.     Left Ear: Tympanic membrane, ear canal and external ear normal.     Nose: Nose normal.     Mouth/Throat:     Mouth: Mucous membranes are moist.     Pharynx: Oropharynx is clear. No oropharyngeal exudate.  Eyes:     General: No scleral icterus.    Conjunctiva/sclera: Conjunctivae normal.     Pupils: Pupils are equal, round, and reactive to light.  Neck:     Thyroid: No thyromegaly.  Cardiovascular:  Rate and Rhythm: Normal rate and regular rhythm.     Heart sounds: Normal heart sounds. No murmur heard. Pulmonary:     Effort: Pulmonary effort is normal. No respiratory distress.     Breath sounds: Normal breath sounds. No wheezing or rales.  Abdominal:     General: There is no distension.     Palpations: Abdomen is soft.     Tenderness: There is no abdominal tenderness.  Musculoskeletal:        General: No deformity.     Cervical back: Neck supple.     Right lower leg: No edema.     Left lower leg: No edema.  Lymphadenopathy:     Cervical: No cervical adenopathy.  Skin:    General: Skin is warm and dry.     Findings: No rash.  Neurological:     Mental Status: She is alert and oriented to person, place, and time. Mental status is at baseline.     Gait: Gait normal.  Psychiatric:        Mood and Affect: Mood normal.         Behavior: Behavior normal.        Thought Content: Thought content normal.      No results found for any visits on 07/23/23.     Assessment & Plan:    Routine Health Maintenance and Physical Exam  Immunization History  Administered Date(s) Administered   Fluad Quad(high Dose 65+) 07/14/2019   Fluad Trivalent(High Dose 65+) 07/23/2023   Influenza, High Dose Seasonal PF 07/09/2017, 10/03/2018   PFIZER Comirnaty(Gray Top)Covid-19 Tri-Sucrose Vaccine 12/07/2020   PFIZER(Purple Top)SARS-COV-2 Vaccination 11/06/2019, 12/02/2019   Pneumococcal Conjugate-13 10/20/2014   Pneumococcal Polysaccharide-23 10/25/2015    Health Maintenance  Topic Date Due   DTaP/Tdap/Td (1 - Tdap) Never done   DEXA SCAN  04/29/2023   COVID-19 Vaccine (4 - 2023-24 season) 05/12/2023   Zoster Vaccines- Shingrix (1 of 2) 09/02/2023 (Originally 02/17/1997)   Medicare Annual Wellness (AWV)  06/02/2024   Pneumonia Vaccine 50+ Years old  Completed   INFLUENZA VACCINE  Completed   Hepatitis C Screening  Completed   HPV VACCINES  Aged Out   Colonoscopy  Discontinued    Discussed health benefits of physical activity, and encouraged her to engage in regular exercise appropriate for her age and condition.  Problem List Items Addressed This Visit       Endocrine   Adult hypothyroidism    Well-managed on Synthroid 50 mcg daily. Last thyroid level within normal range. - Continue Synthroid 50 mcg daily - Refill Synthroid prescription - Monitor thyroid levels with TSH in ordered labs      Relevant Medications   levothyroxine (SYNTHROID) 50 MCG tablet   Other Relevant Orders   TSH     Other   Prediabetes    Recommend low carb diet Recheck A1c       Relevant Orders   Hemoglobin A1c   HLD (hyperlipidemia)    Reviewed last lipid panel Not currently on a statin Recheck FLP and CMP Discussed diet and exercise       Relevant Orders   Lipid Panel With LDL/HDL Ratio   Comprehensive metabolic panel    Other Visit Diagnoses     Encounter for annual physical exam    -  Primary   Relevant Orders   Lipid Panel With LDL/HDL Ratio   TSH   Hemoglobin A1c   Comprehensive metabolic panel   Immunization due  Relevant Orders   Flu Vaccine Trivalent High Dose (Fluad) (Completed)   Influenza vaccine needed       Relevant Orders   Flu Vaccine Trivalent High Dose (Fluad) (Completed)   Encounter for osteoporosis screening in asymptomatic postmenopausal patient       Hypothyroidism       Relevant Medications   levothyroxine (SYNTHROID) 50 MCG tablet          General Health Maintenance Routine physical examination. Discussed vaccinations and screenings. Recommended shingles vaccine due to likely exposure, tetanus vaccine if last dose was over 10 years ago, and RSV vaccine as a one-time dose during RSV season. Confirmed upcoming bone density scan and lab work. - Administer flu shot (completed) - Recommend shingles vaccine - Recommend tetanus vaccine if last dose was over 10 years ago - Recommend RSV vaccine - Order bone density scan - Order labs: cholesterol, kidney and liver function, A1c, TSH - Schedule six-month follow-up.        Return in about 6 months (around 01/20/2024) for chronic disease f/u.     Shirlee Latch, MD

## 2023-07-23 NOTE — Assessment & Plan Note (Signed)
Reviewed last lipid panel Not currently on a statin Recheck FLP and CMP Discussed diet and exercise  

## 2023-07-23 NOTE — Assessment & Plan Note (Signed)
Well-managed on Synthroid 50 mcg daily. Last thyroid level within normal range. - Continue Synthroid 50 mcg daily - Refill Synthroid prescription - Monitor thyroid levels with TSH in ordered labs

## 2023-07-23 NOTE — Assessment & Plan Note (Signed)
Recommend low carb diet °Recheck A1c  °

## 2023-07-24 LAB — COMPREHENSIVE METABOLIC PANEL
ALT: 12 [IU]/L (ref 0–32)
AST: 20 [IU]/L (ref 0–40)
Albumin: 4.6 g/dL (ref 3.8–4.8)
Alkaline Phosphatase: 65 [IU]/L (ref 44–121)
BUN/Creatinine Ratio: 21 (ref 12–28)
BUN: 12 mg/dL (ref 8–27)
Bilirubin Total: 0.7 mg/dL (ref 0.0–1.2)
CO2: 25 mmol/L (ref 20–29)
Calcium: 9.7 mg/dL (ref 8.7–10.3)
Chloride: 98 mmol/L (ref 96–106)
Creatinine, Ser: 0.58 mg/dL (ref 0.57–1.00)
Globulin, Total: 2.6 g/dL (ref 1.5–4.5)
Glucose: 103 mg/dL — ABNORMAL HIGH (ref 70–99)
Potassium: 4.2 mmol/L (ref 3.5–5.2)
Sodium: 138 mmol/L (ref 134–144)
Total Protein: 7.2 g/dL (ref 6.0–8.5)
eGFR: 94 mL/min/{1.73_m2} (ref >59)

## 2023-07-24 LAB — LIPID PANEL WITH LDL/HDL RATIO
Cholesterol, Total: 249 mg/dL — ABNORMAL HIGH (ref 100–199)
HDL: 59 mg/dL (ref >39)
LDL Chol Calc (NIH): 175 mg/dL — ABNORMAL HIGH (ref 0–99)
LDL/HDL Ratio: 3 ratio (ref 0.0–3.2)
Triglycerides: 85 mg/dL (ref 0–149)
VLDL Cholesterol Cal: 15 mg/dL (ref 5–40)

## 2023-07-24 LAB — TSH: TSH: 3.69 u[IU]/mL (ref 0.450–4.500)

## 2023-07-24 LAB — HEMOGLOBIN A1C
Est. average glucose Bld gHb Est-mCnc: 128 mg/dL
Hgb A1c MFr Bld: 6.1 % — ABNORMAL HIGH (ref 4.8–5.6)

## 2023-10-02 ENCOUNTER — Ambulatory Visit
Admission: RE | Admit: 2023-10-02 | Discharge: 2023-10-02 | Disposition: A | Discharge status: Home / Self Care | Payer: Medicare PPO | Source: Ambulatory Visit | Attending: Family Medicine | Admitting: Family Medicine

## 2023-10-02 DIAGNOSIS — Z1231 Encounter for screening mammogram for malignant neoplasm of breast: Secondary | ICD-10-CM | POA: Insufficient documentation

## 2023-10-02 DIAGNOSIS — M8589 Other specified disorders of bone density and structure, multiple sites: Secondary | ICD-10-CM | POA: Insufficient documentation

## 2023-10-02 DIAGNOSIS — Z13820 Encounter for screening for osteoporosis: Secondary | ICD-10-CM | POA: Insufficient documentation

## 2023-10-02 DIAGNOSIS — Z780 Asymptomatic menopausal state: Secondary | ICD-10-CM | POA: Diagnosis not present

## 2023-10-03 ENCOUNTER — Encounter: Payer: Self-pay | Admitting: Family Medicine

## 2024-01-20 ENCOUNTER — Encounter: Payer: Self-pay | Admitting: Family Medicine

## 2024-01-20 ENCOUNTER — Ambulatory Visit: Payer: Self-pay | Admitting: Family Medicine

## 2024-01-20 VITALS — BP 108/74 | HR 57 | Ht 64.0 in | Wt 114.0 lb

## 2024-01-20 DIAGNOSIS — E039 Hypothyroidism, unspecified: Secondary | ICD-10-CM

## 2024-01-20 DIAGNOSIS — R7303 Prediabetes: Secondary | ICD-10-CM

## 2024-01-20 DIAGNOSIS — E785 Hyperlipidemia, unspecified: Secondary | ICD-10-CM | POA: Diagnosis not present

## 2024-01-20 NOTE — Assessment & Plan Note (Signed)
 Patient is currently eating a low carbohydrate diet and walking regularly. She has intentionally lost a couple of pounds since Christmas.  -Recheck A1C -Continue diet and exercise regimen

## 2024-01-20 NOTE — Progress Notes (Signed)
 Established Patient Office Visit  Subjective   Patient ID: Janet Craig, female    DOB: 1946-04-25  Age: 78 y.o. MRN: [id number]  Chief Complaint  Patient presents with   Medical Management of Chronic Issues    Pt was last seen on 07/23/23. Abnormal lipid, Hgb A1c, and CMP labs. Currently only being treated for Thyroid . Patient was advised low carb diet and regular exercise and recommended to start Crestor 5mg  daily. Pt did not want to begin a stating she just wanted to try and make changes for now   Hypothyroidism    Patient reports taking medication as prescribed with no symptoms to report   Hyperlipidemia    Patient reports no symptoms and has been exercising more by walking four to five times a week for about 3 miles   Pre-Diabetes    Pt does not monitor at home with no symptoms to report   Care Management    Zoster Vaccine Tetanus Vaccine     Janet Craig is a 78 y/o female presenting for chronic disease follow-up. She is feeling well today and is in a good mood because spring is in full swing. She has been walking regularly for exercise and is eating a low carb diet consisting of plain cheerios for breakfast, a salad or sandwich for lunch, and dinners with lots of vegetables. She is due for her tetanus and shingles vaccines which she is planning to get at the pharmacy. She is taking Synthroid  50mcg and is having no issues with the medication. She has no acute concerns today and is interested to see how her lipids have changed since November 2024.  Hyperlipidemia Pertinent negatives include no chest pain or shortness of breath.      Review of Systems  Constitutional:  Negative for weight loss.  HENT:  Negative for congestion, ear pain and sinus pain.   Eyes:  Negative for blurred vision and double vision.  Respiratory:  Negative for cough and shortness of breath.   Cardiovascular:  Negative for chest pain, orthopnea and leg swelling.  Gastrointestinal:  Negative for  heartburn, nausea and vomiting.  Genitourinary:  Negative for dysuria and urgency.  Musculoskeletal: Negative.   Skin:  Negative for rash.  Neurological:  Negative for weakness and headaches.  Psychiatric/Behavioral:  Negative for depression.       Objective:     BP 108/74 (BP Location: Left Arm, Patient Position: Sitting, Cuff Size: Normal)   Pulse (!) 57   Ht 5\' 4"  (1.626 m)   Wt 114 lb (51.7 kg)   SpO2 100%   BMI 19.57 kg/m    Physical Exam Constitutional:      Appearance: Normal appearance.  HENT:     Head: Normocephalic and atraumatic.     Nose: Nose normal.  Eyes:     Extraocular Movements: Extraocular movements intact.     Conjunctiva/sclera: Conjunctivae normal.     Pupils: Pupils are equal, round, and reactive to light.  Cardiovascular:     Rate and Rhythm: Normal rate and regular rhythm.     Pulses: Normal pulses.     Heart sounds: Normal heart sounds. No murmur heard.    No gallop.  Pulmonary:     Effort: Pulmonary effort is normal. No respiratory distress.     Breath sounds: Normal breath sounds. No rales.  Abdominal:     General: Abdomen is flat. There is no distension.     Palpations: Abdomen is soft.  Musculoskeletal:  General: Normal range of motion.  Skin:    General: Skin is warm and dry.     Findings: No rash.  Neurological:     General: No focal deficit present.     Mental Status: She is alert and oriented to person, place, and time. Mental status is at baseline.     Sensory: No sensory deficit.  Psychiatric:        Mood and Affect: Mood normal.        Behavior: Behavior normal.      No results found for any visits on 01/20/24.  Last lipids Lab Results  Component Value Date   CHOL 249 (H) 07/23/2023   HDL 59 07/23/2023   LDLCALC 175 (H) 07/23/2023   TRIG 85 07/23/2023   CHOLHDL 4.0 01/18/2023   Last hemoglobin A1c Lab Results  Component Value Date   HGBA1C 6.1 (H) 07/23/2023   Last thyroid  functions Lab Results   Component Value Date   TSH 3.690 07/23/2023   T4TOTAL 5.4 04/19/2017      The 10-year ASCVD risk score (Arnett DK, et al., 2019) is: 13.3%    Assessment & Plan:   Problem List Items Addressed This Visit       Endocrine   Adult hypothyroidism   Well-managed on Synthroid  50 mcg daily. Most recent thyroid  level WNL. - Continue Synthroid  50 mcg daily - Refill Synthroid  when needed; currently doesn't need refill.  - Monitor TSH (ordered)      Relevant Orders   TSH     Other   Prediabetes - Primary   Patient is currently eating a low carbohydrate diet and walking regularly. She has intentionally lost a couple of pounds since Christmas.  -Recheck A1C -Continue diet and exercise regimen      Relevant Orders   Comprehensive metabolic panel with GFR   HgB I6N   HLD (hyperlipidemia)   Patient is currently exercising by walking and eating a diet low in carbohydrates.  -Lipid Panel Ordered -Will consider Statin therapy if her results are unchanged from last visit/still high      Relevant Orders   Lipid Panel With LDL/HDL Ratio    Return in about 6 months (around 07/22/2024) for CPE.    Monda Angry, Medical Student   Patient seen along with MS[employer] student, Luther Saltness. I personally evaluated this patient along with the student, and verified all aspects of the history, physical exam, and medical decision making as documented by the student. I agree with the student's documentation and have made all necessary edits.  Marji Kuehnel, Stan Eans, MD, MPH [facility]

## 2024-01-20 NOTE — Assessment & Plan Note (Addendum)
 Well-managed on Synthroid  50 mcg daily. Most recent thyroid  level WNL. - Continue Synthroid  50 mcg daily - Refill Synthroid  when needed; currently doesn't need refill.  - Monitor TSH (ordered)

## 2024-01-20 NOTE — Assessment & Plan Note (Signed)
 Patient is currently exercising by walking and eating a diet low in carbohydrates.  -Lipid Panel Ordered -Will consider Statin therapy if her results are unchanged from last visit/still high

## 2024-01-21 LAB — COMPREHENSIVE METABOLIC PANEL WITH GFR
ALT: 14 IU/L (ref 0–32)
AST: 20 IU/L (ref 0–40)
Albumin: 4.6 g/dL (ref 3.8–4.8)
Alkaline Phosphatase: 66 IU/L (ref 44–121)
BUN/Creatinine Ratio: 15 (ref 12–28)
BUN: 9 mg/dL (ref 8–27)
Bilirubin Total: 0.7 mg/dL (ref 0.0–1.2)
CO2: 24 mmol/L (ref 20–29)
Calcium: 9.7 mg/dL (ref 8.7–10.3)
Chloride: 98 mmol/L (ref 96–106)
Creatinine, Ser: 0.62 mg/dL (ref 0.57–1.00)
Globulin, Total: 2.3 g/dL (ref 1.5–4.5)
Glucose: 97 mg/dL (ref 70–99)
Potassium: 4.3 mmol/L (ref 3.5–5.2)
Sodium: 140 mmol/L (ref 134–144)
Total Protein: 6.9 g/dL (ref 6.0–8.5)
eGFR: 92 mL/min/{1.73_m2} (ref >59)

## 2024-01-21 LAB — LIPID PANEL WITH LDL/HDL RATIO
Cholesterol, Total: 207 mg/dL — ABNORMAL HIGH (ref 100–199)
HDL: 55 mg/dL (ref >39)
LDL Chol Calc (NIH): 139 mg/dL — ABNORMAL HIGH (ref 0–99)
LDL/HDL Ratio: 2.5 ratio (ref 0.0–3.2)
Triglycerides: 73 mg/dL (ref 0–149)
VLDL Cholesterol Cal: 13 mg/dL (ref 5–40)

## 2024-01-21 LAB — HEMOGLOBIN A1C
Est. average glucose Bld gHb Est-mCnc: 134 mg/dL
Hgb A1c MFr Bld: 6.3 % — ABNORMAL HIGH (ref 4.8–5.6)

## 2024-01-21 LAB — TSH: TSH: 2.35 u[IU]/mL (ref 0.450–4.500)

## 2024-01-24 ENCOUNTER — Ambulatory Visit: Payer: Self-pay | Admitting: Family Medicine

## 2024-06-03 ENCOUNTER — Ambulatory Visit: Payer: Medicare PPO

## 2024-06-03 DIAGNOSIS — Z0000 Encounter for general adult medical examination without abnormal findings: Secondary | ICD-10-CM

## 2024-06-03 NOTE — Patient Instructions (Addendum)
 Janet Craig,  Thank you for taking the time for your Medicare Wellness Visit. I appreciate your continued commitment to your health goals. Please review the care plan we discussed, and feel free to reach out if I can assist you further.  Medicare recommends these wellness visits once per year to help you and your care team stay ahead of potential health issues. These visits are designed to focus on prevention, allowing your provider to concentrate on managing your acute and chronic conditions during your regular appointments.  Please note that Annual Wellness Visits do not include a physical exam. Some assessments may be limited, especially if the visit was conducted virtually. If needed, we may recommend a separate in-person follow-up with your provider.  Ongoing Care Seeing your primary care provider every 3 to 6 months helps us  monitor your health and provide consistent, personalized care.   Referrals If a referral was made during today's visit and you haven't received any updates within two weeks, please contact the referred provider directly to check on the status.  Recommended Screenings:  Health Maintenance  Topic Date Due   DTaP/Tdap/Td vaccine (1 - Tdap) Never done   Zoster (Shingles) Vaccine (1 of 2) Never done   Flu Shot  04/10/2024   COVID-19 Vaccine (4 - 2025-26 season) 05/11/2024   Medicare Annual Wellness Visit  06/03/2025   DEXA scan (bone density measurement)  10/01/2028   Pneumococcal Vaccine for age over 30  Completed   Hepatitis C Screening  Completed   HPV Vaccine  Aged Out   Meningitis B Vaccine  Aged Out   Breast Cancer Screening  Discontinued   Colon Cancer Screening  Discontinued     Advance Care Planning is important because it: Ensures you receive medical care that aligns with your values, goals, and preferences. Provides guidance to your family and loved ones, reducing the emotional burden of decision-making during critical moments.  Vision: Annual  vision screenings are recommended for early detection of glaucoma, cataracts, and diabetic retinopathy. These exams can also reveal signs of chronic conditions such as diabetes and high blood pressure.  Dental: Annual dental screenings help detect early signs of oral cancer, gum disease, and other conditions linked to overall health, including heart disease and diabetes.  Please see the attached documents for additional preventive care recommendations.   NEXT AWV 06/09/25 @ 9:30 AM BY PHONE

## 2024-06-03 NOTE — Progress Notes (Signed)
 Subjective:   Janet Craig is a 78 y.o. who presents for a Medicare Wellness preventive visit.  As a reminder, Annual Wellness Visits don't include a physical exam, and some assessments may be limited, especially if this visit is performed virtually. We may recommend an in-person follow-up visit with your provider if needed.  Visit Complete: Virtual I connected with  [name] on 06/03/24 by a audio enabled telemedicine application and verified that I am speaking with the correct person using two identifiers.  Patient Location: Home  Provider Location: Home Office  I discussed the limitations of evaluation and management by telemedicine. The patient expressed understanding and agreed to proceed.  Vital Signs: Because this visit was a virtual/telehealth visit, some criteria may be missing or patient reported. Any vitals not documented were not able to be obtained and vitals that have been documented are patient reported.  VideoDeclined- This patient declined Librarian, academic. Therefore the visit was completed with audio only.  Persons Participating in Visit: Patient.  AWV Questionnaire: No: Patient Medicare AWV questionnaire was not completed prior to this visit.  Cardiac Risk Factors include: advanced age (>19men, >36 women);dyslipidemia     Objective:    There were no vitals filed for this visit. There is no height or weight on file to calculate BMI.     06/03/2024    9:41 AM 06/03/2023    9:26 AM 05/29/2022    9:36 AM 10/19/2019    2:16 PM 03/31/2018    1:51 PM 03/29/2017   11:11 AM  Advanced Directives  Does Patient Have a Medical Advance Directive? No Yes No Yes Yes  Yes   Type of Furniture conservator/restorer;Living will  Healthcare Power of [place];Living will Healthcare Power of [place];Living will Healthcare Power of [place];Living will  Copy of Healthcare Power of Attorney in Chart?    No - copy requested No -  copy requested  No - copy requested   Would patient like information on creating a medical advance directive? No - Patient declined  No - Patient declined        Data saved with a previous flowsheet row definition    Current Medications (verified) Outpatient Encounter Medications as of 06/03/2024  Medication Sig   levothyroxine  (SYNTHROID ) 50 MCG tablet Take 1 tablet (50 mcg total) by mouth daily before breakfast.   Multiple Vitamin tablet Take 1 tablet by mouth daily.    Omega-3 Fatty Acids (FISH OIL MAXIMUM STRENGTH) 1200 MG CPDR Take 1,200 mg by mouth daily.  (Patient not taking: Reported on 06/03/2024)   No facility-administered encounter medications on file as of 06/03/2024.    Allergies (verified) Neomycin-bacitracin zn-polymyx, Penicillins, and Miconazole nitrate   History: Past Medical History:  Diagnosis Date   Hyperlipidemia    Past Surgical History:  Procedure Laterality Date   ABDOMINAL HYSTERECTOMY     APPENDECTOMY     COLONOSCOPY  12/2005   EYE SURGERY     LUMBAR MICRODISCECTOMY  11/2010   TOTAL HIP ARTHROPLASTY Left 02/24/13   Family History  Problem Relation Age of Onset   Breast cancer Mother 90   Lung cancer Mother    CVA Father    Lung cancer Father    Melanoma Father    Multiple sclerosis Sister    Diabetes Sister    Atrial fibrillation Sister    Lupus Brother    Colon cancer Paternal Aunt    Lung cancer Maternal Grandfather  Social History   Socioeconomic History   Marital status: Married    Spouse name: Not on file   Number of children: 1   Years of education: Not on file   Highest education level: Master's degree (e.g., MA, MS, MEng, MEd, MSW, MBA)  Occupational History   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  Tobacco Use   Smoking status: Never   Smokeless tobacco: Never  Vaping Use   Vaping status: Never Used  Substance and Sexual Activity   Alcohol use: No   Drug use: No   Sexual activity: Yes  Other Topics Concern    Not on file  Social History Narrative   Not on file   Social Drivers of Health   Financial Resource Strain: Low Risk  (06/03/2024)   Overall Financial Resource Strain (CARDIA)    Difficulty of Paying Living Expenses: Not hard at all  Food Insecurity: No Food Insecurity (06/03/2024)   Hunger Vital Sign    Worried About Running Out of Food in the Last Year: Never true    Ran Out of Food in the Last Year: Never true  Transportation Needs: No Transportation Needs (06/03/2024)   PRAPARE - Administrator, Civil Service (Medical): No    Lack of Transportation (Non-Medical): No  Physical Activity: Sufficiently Active (06/03/2024)   Exercise Vital Sign    Days of Exercise per Week: 7 days    Minutes of Exercise per Session: 40 min  Stress: No Stress Concern Present (06/03/2024)   [employer] of Occupational Health - Occupational Stress Questionnaire    Feeling of Stress: Not at all  Social Connections: Moderately Integrated (06/03/2024)   Social Connection and Isolation Panel    Frequency of Communication with Friends and Family: More than three times a week    Frequency of Social Gatherings with Friends and Family: Once a week    Attends Religious Services: More than 4 times per year    Active Member of [employer] or Organizations: No    Attends Engineer, structural: Never    Marital Status: Married    Tobacco Counseling Counseling given: Not Answered    Clinical Intake:  Pre-visit preparation completed: Yes  Pain : No/denies pain     BMI - recorded: 19.6 Nutritional Status: BMI of 19-24  Normal Nutritional Risks: None Diabetes: No  Lab Results  Component Value Date   HGBA1C 6.3 (H) 01/20/2024   HGBA1C 6.1 (H) 07/23/2023   HGBA1C 6.0 (H) 01/18/2023     How often do you need to have someone help you when you read instructions, pamphlets, or other written materials from your doctor or pharmacy?: 1 - Never  Interpreter Needed?: No  Information  entered by :: JHONNIE DAS, LPN   Activities of Daily Living    06/03/2024    9:43 AM  In your present state of health, do you have any difficulty performing the following activities:  Hearing? 0  Vision? 0  Difficulty concentrating or making decisions? 0  Walking or climbing stairs? 0  Dressing or bathing? 0  Doing errands, shopping? 0  Preparing Food and eating ? N  Using the Toilet? N  In the past six months, have you accidently leaked urine? N  Do you have problems with loss of bowel control? N  Managing your Medications? N  Managing your Finances? N  Housekeeping or managing your Housekeeping? N    Patient Care Team: Myrla Jon HERO, MD as PCP - General (  Family Medicine) Jaye Fallow, MD as Referring Physician (Ophthalmology)  I have updated your Care Teams any recent Medical Services you may have received from other providers in the past year.     Assessment:   This is a routine wellness examination for Emmogene.  Hearing/Vision screen Hearing Screening - Comments:: NO AIDS Vision Screening - Comments:: READERS, HAS LENSES IN BOTH EYES- [facility]   Goals Addressed             This Visit's Progress    DIET - INCREASE WATER INTAKE         Depression Screen     06/03/2024    9:40 AM 01/20/2024    8:32 AM 06/03/2023    9:21 AM 01/17/2023    2:35 PM 07/09/2022    8:19 AM 05/29/2022    9:34 AM 10/09/2021    9:29 AM  PHQ 2/9 Scores  PHQ - 2 Score 0 0 0 0 0 0 0  PHQ- 9 Score 0   0 0 0     Fall Risk     06/03/2024    9:42 AM 01/20/2024    8:32 AM 06/03/2023    9:19 AM 01/17/2023    2:35 PM 07/09/2022    8:19 AM  Fall Risk   Falls in the past year? 1 0 0 0 0  Number falls in past yr: 0 0 0 0 0  Injury with Fall? 0 0 0 0 0  Risk for fall due to :  No Fall Risks No Fall Risks No Fall Risks No Fall Risks  Follow up Falls evaluation completed;Falls prevention discussed Falls evaluation completed Falls prevention discussed;Education provided Falls  evaluation completed Falls evaluation completed      Data saved with a previous flowsheet row definition    MEDICARE RISK AT HOME:  Medicare Risk at Home Any stairs in or around the home?: Yes If so, are there any without handrails?: No Home free of loose throw rugs in walkways, pet beds, electrical cords, etc?: Yes Adequate lighting in your home to reduce risk of falls?: Yes Life alert?: No Use of a cane, walker or w/c?: No Grab bars in the bathroom?: No Shower chair or bench in shower?: No Elevated toilet seat or a handicapped toilet?: No  TIMED UP AND GO:  Was the test performed?  No  Cognitive Function: 6CIT completed        06/03/2024    9:45 AM 06/03/2023    9:36 AM 05/29/2022    9:44 AM 10/19/2019    2:19 PM 03/29/2017   11:14 AM  6CIT Screen  What Year? 0 points 0 points 0 points 0 points 0 points  What month? 0 points 0 points 0 points 0 points 0 points  What time? 0 points 0 points 0 points 0 points 0 points  Count back from 20 0 points 0 points 0 points 0 points 0 points  Months in reverse 0 points 0 points 0 points 0 points 0 points  Repeat phrase 0 points 0 points 2 points 0 points 0 points  Total Score 0 points 0 points 2 points 0 points 0 points    Immunizations Immunization History  Administered Date(s) Administered   Fluad Quad(high Dose 65+) 07/14/2019   Fluad Trivalent(High Dose 65+) 07/23/2023   INFLUENZA, HIGH DOSE SEASONAL PF 07/09/2017, 10/03/2018   PFIZER Comirnaty(Gray Top)Covid-19 Tri-Sucrose Vaccine 12/07/2020   PFIZER(Purple Top)SARS-COV-2 Vaccination 11/06/2019, 12/02/2019   Pneumococcal Conjugate-13 10/20/2014   Pneumococcal Polysaccharide-23 10/25/2015  Screening Tests Health Maintenance  Topic Date Due   DTaP/Tdap/Td (1 - Tdap) Never done   Zoster Vaccines- Shingrix (1 of 2) Never done   Influenza Vaccine  04/10/2024   COVID-19 Vaccine (4 - 2025-26 season) 05/11/2024   Medicare Annual Wellness (AWV)  06/03/2025   DEXA SCAN   10/01/2028   Pneumococcal Vaccine: 50+ Years  Completed   Hepatitis C Screening  Completed   HPV VACCINES  Aged Out   Meningococcal B Vaccine  Aged Out   Mammogram  Discontinued   Colonoscopy  Discontinued    Health Maintenance Items Addressed: NEEDS SHINGRIX, PNA; AGED OUT OF MAMMOGRAM & COLONOSCOPY; UP TO DATE ON BDS  Additional Screening:  Vision Screening: Recommended annual ophthalmology exams for early detection of glaucoma and other disorders of the eye. Is the patient up to date with their annual eye exam?  Yes  Who is the provider or what is the name of the office in which the patient attends annual eye exams? [facility]  Dental Screening: Recommended annual dental exams for proper oral hygiene  Community Resource Referral / Chronic Care Management: CRR required this visit?  No   CCM required this visit?  No   Plan:    I have personally reviewed and noted the following in the patient's chart:   Medical and social history Use of alcohol, tobacco or illicit drugs  Current medications and supplements including opioid prescriptions. Patient is not currently taking opioid prescriptions. Functional ability and status Nutritional status Physical activity Advanced directives List of other physicians Hospitalizations, surgeries, and ER visits in previous 12 months Vitals Screenings to include cognitive, depression, and falls Referrals and appointments  In addition, I have reviewed and discussed with patient certain preventive protocols, quality metrics, and best practice recommendations. A written personalized care plan for preventive services as well as general preventive health recommendations were provided to patient.   Jhonnie GORMAN Das, LPN   [id number]   After Visit Summary: (MyChart) Due to this being a telephonic visit, the after visit summary with patients personalized plan was offered to patient via MyChart   Notes: Nothing significant to report at this  time.

## 2024-07-23 ENCOUNTER — Ambulatory Visit: Admitting: Family Medicine

## 2024-07-23 ENCOUNTER — Encounter: Payer: Self-pay | Admitting: Family Medicine

## 2024-07-23 VITALS — BP 108/68 | HR 58 | Ht 64.0 in | Wt 120.8 lb

## 2024-07-23 DIAGNOSIS — Z1231 Encounter for screening mammogram for malignant neoplasm of breast: Secondary | ICD-10-CM | POA: Diagnosis not present

## 2024-07-23 DIAGNOSIS — R7303 Prediabetes: Secondary | ICD-10-CM

## 2024-07-23 DIAGNOSIS — Z23 Encounter for immunization: Secondary | ICD-10-CM | POA: Diagnosis not present

## 2024-07-23 DIAGNOSIS — Z0000 Encounter for general adult medical examination without abnormal findings: Secondary | ICD-10-CM

## 2024-07-23 DIAGNOSIS — E785 Hyperlipidemia, unspecified: Secondary | ICD-10-CM

## 2024-07-23 DIAGNOSIS — E039 Hypothyroidism, unspecified: Secondary | ICD-10-CM

## 2024-07-23 NOTE — Assessment & Plan Note (Signed)
 Monitored with routine lab work. - Ordered A1c test

## 2024-07-23 NOTE — Patient Instructions (Signed)
Consider asking at the pharmacy about the tetanus shot (TDAP) ?

## 2024-07-23 NOTE — Progress Notes (Signed)
 Complete physical exam   Patient: Janet Craig   DOB: 03/04/1946   78 y.o. Female  MRN: [id number] Visit Date: 07/23/2024  Today's healthcare provider: Jon Eva, MD   Chief Complaint  Patient presents with   Annual Exam    Last completed 07/23/23 Diet -  Well balanced consuming 3 meals a day Exercise - walking two to four and half miles depending on the weather Feeling - very well Sleeping - very well Concerns -  none   Subjective    Janet Craig is a 78 y.o. female who presents today for a complete physical exam.    Discussed the use of AI scribe software for clinical note transcription with the patient, who gave verbal consent to proceed.  History of Present Illness   Janet Craig is a 78 year old female who presents for an annual physical exam.  She has prediabetes and regularly monitors her blood sugar levels. Her last A1c was checked, and she is due for another test today. She is not on any diabetes medication.  She has hypothyroidism and takes Synthroid  regularly. Her thyroid  function is monitored routinely, with no issues reported with her medication.  Her preventive care is current, including vaccinations. She has completed her pneumonia vaccinations and recently received a tetanus shot. Her last mammogram was in January, and she has completed her colonoscopy screenings.       Last depression screening scores    06/03/2024    9:40 AM 01/20/2024    8:32 AM 06/03/2023    9:21 AM  PHQ 2/9 Scores  PHQ - 2 Score 0 0 0  PHQ- 9 Score 0        Data saved with a previous flowsheet row definition   Last fall risk screening    06/03/2024    9:42 AM  Fall Risk   Falls in the past year? 1  Number falls in past yr: 0  Injury with Fall? 0  Follow up Falls evaluation completed;Falls prevention discussed        Medications: Outpatient Medications Prior to Visit  Medication Sig   levothyroxine  (SYNTHROID ) 50 MCG tablet Take 1 tablet (50  mcg total) by mouth daily before breakfast.   Multiple Vitamin tablet Take 1 tablet by mouth daily.    Omega-3 Fatty Acids (FISH OIL MAXIMUM STRENGTH) 1200 MG CPDR Take 1,200 mg by mouth daily.    No facility-administered medications prior to visit.    Review of Systems    Objective    BP 108/68 (BP Location: Left Arm, Patient Position: Sitting, Cuff Size: Normal)   Pulse (!) 58   Ht 5' 4 (1.626 m)   Wt 120 lb 12.8 oz (54.8 kg)   SpO2 100%   BMI 20.74 kg/m    Physical Exam Vitals reviewed.  Constitutional:      General: She is not in acute distress.    Appearance: Normal appearance. She is well-developed. She is not diaphoretic.  HENT:     Head: Normocephalic and atraumatic.     Right Ear: Tympanic membrane, ear canal and external ear normal.     Left Ear: Tympanic membrane, ear canal and external ear normal.     Nose: Nose normal.     Mouth/Throat:     Mouth: Mucous membranes are moist.     Pharynx: Oropharynx is clear. No oropharyngeal exudate.  Eyes:     General: No scleral icterus.    Conjunctiva/sclera: Conjunctivae normal.  Pupils: Pupils are equal, round, and reactive to light.  Neck:     Thyroid : No thyromegaly.  Cardiovascular:     Rate and Rhythm: Normal rate and regular rhythm.     Heart sounds: Normal heart sounds. No murmur heard. Pulmonary:     Effort: Pulmonary effort is normal. No respiratory distress.     Breath sounds: Normal breath sounds. No wheezing or rales.  Abdominal:     General: There is no distension.     Palpations: Abdomen is soft.     Tenderness: There is no abdominal tenderness.  Musculoskeletal:        General: No deformity.     Cervical back: Neck supple.     Right lower leg: No edema.     Left lower leg: No edema.  Lymphadenopathy:     Cervical: No cervical adenopathy.  Skin:    General: Skin is warm and dry.     Findings: No rash.  Neurological:     Mental Status: She is alert and oriented to person, place, and time.  Mental status is at baseline.     Gait: Gait normal.  Psychiatric:        Mood and Affect: Mood normal.        Behavior: Behavior normal.        Thought Content: Thought content normal.      No results found for any visits on 07/23/24.  Assessment & Plan    Routine Health Maintenance and Physical Exam  Exercise Activities and Dietary recommendations  Goals      DIET - INCREASE WATER INTAKE        Immunization History  Administered Date(s) Administered    sv, Bivalent, Protein Subunit Rsvpref,pf [place]) 08/28/2023   Fluad Quad(high Dose 65+) 07/14/2019   Fluad Trivalent(High Dose 65+) 07/23/2023   INFLUENZA, HIGH DOSE SEASONAL PF 07/09/2017, 10/03/2018, 07/23/2024   PFIZER Comirnaty(Gray Top)Covid-19 Tri-Sucrose Vaccine 12/07/2020   PFIZER(Purple Top)SARS-COV-2 Vaccination 11/06/2019, 12/02/2019   Pneumococcal Conjugate-13 10/20/2014   Pneumococcal Polysaccharide-23 10/25/2015    Health Maintenance  Topic Date Due   DTaP/Tdap/Td (1 - Tdap) Never done   Zoster Vaccines- Shingrix (1 of 2) Never done   COVID-19 Vaccine (4 - 2025-26 season) 05/11/2024   Medicare Annual Wellness (AWV)  06/03/2025   DEXA SCAN  10/01/2028   Pneumococcal Vaccine: 50+ Years  Completed   Influenza Vaccine  Completed   Hepatitis C Screening  Completed   Meningococcal B Vaccine  Aged Out   Mammogram  Discontinued   Colonoscopy  Discontinued    Discussed health benefits of physical activity, and encouraged her to engage in regular exercise appropriate for her age and condition.  Problem List Items Addressed This Visit       Endocrine   Adult hypothyroidism   Well-managed with Synthroid . - Continue Synthroid  as prescribed - Ordered thyroid  function tests      Relevant Orders   TSH     Other   Prediabetes   Monitored with routine lab work. - Ordered A1c test      Relevant Orders   Hemoglobin A1c   HLD (hyperlipidemia)   Monitored with routine lab work. - Ordered cholesterol  panel      Relevant Orders   Comprehensive metabolic panel with GFR   Lipid panel   Other Visit Diagnoses       Encounter for annual physical exam    -  Primary   Relevant Orders   Hemoglobin A1c   Comprehensive  metabolic panel with GFR   Lipid panel   TSH     Breast cancer screening by mammogram       Relevant Orders   MM 3D SCREENING MAMMOGRAM BILATERAL BREAST     Immunization due       Relevant Orders   Flu vaccine HIGH DOSE PF(Fluzone Trivalent) (Completed)          Adult Wellness Visit Routine adult wellness visit with no acute concerns. Discussed general health maintenance and screenings. - Ordered mammogram for January - Scheduled six-month follow-up to review blood sugar and thyroid  levels  Immunization management Received tetanus shot at pharmacy. Flu shot administered today. - Administered flu shot - Ordered Prevnar vaccine  General Health Maintenance Routine health maintenance discussed, including screenings and vaccinations. - Ordered kidney and liver function tests       Return in about 6 months (around 01/20/2025) for chronic disease f/u.     Jon Eva, MD  [facility] [phone] (phone) [phone] (fax)  [facility]

## 2024-07-23 NOTE — Assessment & Plan Note (Signed)
 Monitored with routine lab work. - Ordered cholesterol panel

## 2024-07-23 NOTE — Assessment & Plan Note (Signed)
 Well-managed with Synthroid . - Continue Synthroid  as prescribed - Ordered thyroid  function tests

## 2024-07-24 LAB — HEMOGLOBIN A1C
Est. average glucose Bld gHb Est-mCnc: 123 mg/dL
Hgb A1c MFr Bld: 5.9 % — ABNORMAL HIGH (ref 4.8–5.6)

## 2024-07-24 LAB — COMPREHENSIVE METABOLIC PANEL WITH GFR
ALT: 19 IU/L (ref 0–32)
AST: 30 IU/L (ref 0–40)
Albumin: 4.5 g/dL (ref 3.8–4.8)
Alkaline Phosphatase: 65 IU/L (ref 49–135)
BUN/Creatinine Ratio: 22 (ref 12–28)
BUN: 15 mg/dL (ref 8–27)
Bilirubin Total: 0.8 mg/dL (ref 0.0–1.2)
CO2: 25 mmol/L (ref 20–29)
Calcium: 9.5 mg/dL (ref 8.7–10.3)
Chloride: 99 mmol/L (ref 96–106)
Creatinine, Ser: 0.67 mg/dL (ref 0.57–1.00)
Globulin, Total: 2.5 g/dL (ref 1.5–4.5)
Glucose: 104 mg/dL — ABNORMAL HIGH (ref 70–99)
Potassium: 4.2 mmol/L (ref 3.5–5.2)
Sodium: 138 mmol/L (ref 134–144)
Total Protein: 7 g/dL (ref 6.0–8.5)
eGFR: 90 mL/min/1.73 (ref >59)

## 2024-07-24 LAB — TSH: TSH: 6.86 u[IU]/mL — ABNORMAL HIGH (ref 0.450–4.500)

## 2024-07-24 LAB — LIPID PANEL
Chol/HDL Ratio: 3.3 ratio (ref 0.0–4.4)
Cholesterol, Total: 207 mg/dL — ABNORMAL HIGH (ref 100–199)
HDL: 62 mg/dL (ref >39)
LDL Chol Calc (NIH): 134 mg/dL — ABNORMAL HIGH (ref 0–99)
Triglycerides: 63 mg/dL (ref 0–149)
VLDL Cholesterol Cal: 11 mg/dL (ref 5–40)

## 2024-07-27 ENCOUNTER — Ambulatory Visit: Payer: Self-pay | Admitting: Family Medicine

## 2024-07-27 DIAGNOSIS — E039 Hypothyroidism, unspecified: Secondary | ICD-10-CM

## 2024-09-14 ENCOUNTER — Telehealth: Payer: Self-pay | Admitting: Family Medicine

## 2024-09-14 ENCOUNTER — Other Ambulatory Visit: Payer: Self-pay

## 2024-09-14 DIAGNOSIS — E039 Hypothyroidism, unspecified: Secondary | ICD-10-CM

## 2024-09-14 MED ORDER — LEVOTHYROXINE SODIUM 50 MCG PO TABS: 50.0000 ug | ORAL_TABLET | Freq: Every day | ORAL | 3 refills | Status: AC

## 2024-09-14 NOTE — Telephone Encounter (Signed)
"   levothyroxine  (SYNTHROID ) 50 MCG tablet [[id number]]  Pharmacy request  Last refill - 07/23/2023 Last OV - 07/23/2024 "

## 2024-09-14 NOTE — Telephone Encounter (Signed)
"  Med sent in   "

## 2024-10-05 ENCOUNTER — Encounter

## 2024-10-12 ENCOUNTER — Ambulatory Visit
Admission: RE | Admit: 2024-10-12 | Discharge: 2024-10-12 | Disposition: A | Source: Ambulatory Visit | Attending: Family Medicine

## 2024-10-12 DIAGNOSIS — Z1231 Encounter for screening mammogram for malignant neoplasm of breast: Secondary | ICD-10-CM

## 2025-01-21 ENCOUNTER — Ambulatory Visit: Admitting: Family Medicine

## 2025-06-09 ENCOUNTER — Ambulatory Visit
